# Patient Record
Sex: Female | Born: 1980 | Race: Black or African American | Hispanic: No | Marital: Single | Smoking: Current every day smoker
Health system: Southern US, Community
[De-identification: ages and names within clinical notes are randomized; demographics above are authoritative.]

## PROBLEM LIST (undated history)

## (undated) DIAGNOSIS — I10 Essential (primary) hypertension: Secondary | ICD-10-CM

## (undated) HISTORY — PX: ANKLE SURGERY: SHX546

## (undated) HISTORY — PX: OVARIAN CYST DRAINAGE: SHX325

## (undated) HISTORY — PX: APPENDECTOMY: SHX54

## (undated) HISTORY — DX: Essential (primary) hypertension: I10

---

## 2008-01-08 ENCOUNTER — Encounter: Payer: Self-pay | Admitting: Sports Medicine

## 2008-01-24 ENCOUNTER — Encounter: Payer: Self-pay | Admitting: Sports Medicine

## 2008-02-24 ENCOUNTER — Encounter: Payer: Self-pay | Admitting: Sports Medicine

## 2009-11-11 ENCOUNTER — Ambulatory Visit: Payer: Self-pay

## 2011-08-30 ENCOUNTER — Emergency Department (HOSPITAL_COMMUNITY)
Admission: EM | Admit: 2011-08-30 | Discharge: 2011-08-30 | Disposition: A | Discharge status: Home / Self Care | Payer: Medicaid Other | Attending: Emergency Medicine | Admitting: Emergency Medicine

## 2011-08-30 ENCOUNTER — Emergency Department (HOSPITAL_COMMUNITY): Payer: Medicaid Other

## 2011-08-30 ENCOUNTER — Encounter (HOSPITAL_COMMUNITY): Payer: Self-pay | Admitting: *Deleted

## 2011-08-30 DIAGNOSIS — R109 Unspecified abdominal pain: Secondary | ICD-10-CM

## 2011-08-30 DIAGNOSIS — Z9089 Acquired absence of other organs: Secondary | ICD-10-CM | POA: Insufficient documentation

## 2011-08-30 DIAGNOSIS — B9689 Other specified bacterial agents as the cause of diseases classified elsewhere: Secondary | ICD-10-CM | POA: Insufficient documentation

## 2011-08-30 DIAGNOSIS — N760 Acute vaginitis: Secondary | ICD-10-CM | POA: Insufficient documentation

## 2011-08-30 DIAGNOSIS — A499 Bacterial infection, unspecified: Secondary | ICD-10-CM | POA: Insufficient documentation

## 2011-08-30 DIAGNOSIS — F17200 Nicotine dependence, unspecified, uncomplicated: Secondary | ICD-10-CM | POA: Insufficient documentation

## 2011-08-30 LAB — CBC WITH DIFFERENTIAL/PLATELET
Basophils Relative: 0 % (ref 0–1)
HCT: 41.6 % (ref 36.0–46.0)
Hemoglobin: 14 g/dL (ref 12.0–15.0)
Lymphs Abs: 3.5 10*3/uL (ref 0.7–4.0)
MCH: 30 pg (ref 26.0–34.0)
MCHC: 33.7 g/dL (ref 30.0–36.0)
Monocytes Absolute: 0.6 10*3/uL (ref 0.1–1.0)
Monocytes Relative: 6 % (ref 3–12)
Neutro Abs: 5 10*3/uL (ref 1.7–7.7)
Neutrophils Relative %: 54 % (ref 43–77)
RBC: 4.67 MIL/uL (ref 3.87–5.11)

## 2011-08-30 LAB — COMPREHENSIVE METABOLIC PANEL
Alkaline Phosphatase: 64 U/L (ref 39–117)
BUN: 9 mg/dL (ref 6–23)
CO2: 23 mEq/L (ref 19–32)
Chloride: 103 mEq/L (ref 96–112)
Creatinine, Ser: 0.81 mg/dL (ref 0.50–1.10)
GFR calc Af Amer: >90 mL/min (ref >90)
GFR calc non Af Amer: >90 mL/min (ref >90)
Glucose, Bld: 122 mg/dL — ABNORMAL HIGH (ref 70–99)
Potassium: 4 mEq/L (ref 3.5–5.1)
Total Bilirubin: 0.4 mg/dL (ref 0.3–1.2)

## 2011-08-30 LAB — URINALYSIS, ROUTINE W REFLEX MICROSCOPIC
Bilirubin Urine: NEGATIVE
Glucose, UA: NEGATIVE mg/dL
Ketones, ur: NEGATIVE mg/dL
Leukocytes, UA: NEGATIVE
Nitrite: NEGATIVE
Specific Gravity, Urine: 1.01 (ref 1.005–1.030)
pH: 6 (ref 5.0–8.0)

## 2011-08-30 LAB — WET PREP, GENITAL: Yeast Wet Prep HPF POC: NONE SEEN

## 2011-08-30 LAB — LIPASE, BLOOD: Lipase: 22 U/L (ref 11–59)

## 2011-08-30 LAB — URINE MICROSCOPIC-ADD ON

## 2011-08-30 MED ORDER — SODIUM CHLORIDE 0.9 % IV SOLN
INTRAVENOUS | Status: DC
  Administered 2011-08-30: 08:00:00 via INTRAVENOUS

## 2011-08-30 MED ORDER — MORPHINE SULFATE 4 MG/ML IJ SOLN
4.0000 mg | INTRAMUSCULAR | Status: AC | PRN
  Administered 2011-08-30 (×2): 4 mg via INTRAVENOUS
  Filled 2011-08-30 (×2): qty 1

## 2011-08-30 MED ORDER — HYDROCODONE-ACETAMINOPHEN 5-325 MG PO TABS: ORAL_TABLET | ORAL | Status: AC

## 2011-08-30 MED ORDER — OXYCODONE-ACETAMINOPHEN 5-325 MG PO TABS
2.0000 | ORAL_TABLET | Freq: Once | ORAL | Status: AC
  Administered 2011-08-30: 2 via ORAL
  Filled 2011-08-30: qty 2

## 2011-08-30 MED ORDER — NAPROXEN 250 MG PO TABS: 250.0000 mg | ORAL_TABLET | Freq: Two times a day (BID) | ORAL | Status: AC

## 2011-08-30 MED ORDER — ONDANSETRON HCL 4 MG/2ML IJ SOLN
4.0000 mg | INTRAMUSCULAR | Status: DC | PRN
  Administered 2011-08-30: 4 mg via INTRAVENOUS
  Filled 2011-08-30: qty 2

## 2011-08-30 MED ORDER — METRONIDAZOLE 500 MG PO TABS: 500.0000 mg | ORAL_TABLET | Freq: Two times a day (BID) | ORAL | Status: AC

## 2011-08-30 NOTE — ED Notes (Signed)
Pt remains in xray.

## 2011-08-30 NOTE — ED Provider Notes (Signed)
History  This chart was scribed for Lindsay Anger, DO by [name]. This patient was seen in room APA07/APA07.  CSN: [id number]  Arrival date & time 08/30/11  4098   First MD Initiated Contact with Patient 08/30/11 [phone]      Chief Complaint  Patient presents with  . Abdominal Pain     The history is provided by the patient. No language interpreter was used.  Pt was seen at 0805. Lindsay Shaw is a 31 y.o. female who presents to the Emergency Department complaining of gradual onset and persistence of intermittent right lower abd "pain" for the past 3 days.  Pt describes the pain as "sharp."  States it began while she was sitting down watching TV. The pain was radiating to her back originally but denies back pain currently. She denies having prior episodes of similar symptoms. She denies fever, no N/V/D, no dysuria, no vaginal bleeding/discharge.    History reviewed. No pertinent past medical history.  Past Surgical History  Procedure Date  . Appendectomy 2008  . Cesarean section   . Ankle surgery     History  Substance Use Topics  . Smoking status: Current Everyday Smoker  . Smokeless tobacco: Not on file  . Alcohol Use: Yes     Occ    No OB history provided.  Review of Systems ROS: Statement: All systems negative except as marked or noted in the HPI; Constitutional: Negative for fever and chills. ; ; Eyes: Negative for eye pain, redness and discharge. ; ; ENMT: Negative for ear pain, hoarseness, nasal congestion, sinus pressure and sore throat. ; ; Cardiovascular: Negative for chest pain, palpitations, diaphoresis, dyspnea and peripheral edema. ; ; Respiratory: Negative for cough, wheezing and stridor. ; ; Gastrointestinal: +lower abd pain. Negative for nausea, vomiting, diarrhea, blood in stool, hematemesis, jaundice and rectal bleeding. . ; ; Genitourinary: Negative for dysuria, flank pain and hematuria. ; ; GYN:  No vaginal bleeding, no vaginal discharge, no  vulvar pain. ;; Musculoskeletal: Negative for back pain and neck pain. Negative for swelling and trauma.; ; Skin: Negative for pruritus, rash, abrasions, blisters, bruising and skin lesion.; ; Neuro: Negative for headache, lightheadedness and neck stiffness. Negative for weakness, altered level of consciousness , altered mental status, extremity weakness, paresthesias, involuntary movement, seizure and syncope.      Allergies  Review of patient's allergies indicates no known allergies.  Home Medications  No current outpatient prescriptions on file.  Triage Vitals: BP 125/81  Pulse 84  Temp 97.9 F (36.6 C) (Oral)  Resp 16  Ht 5\' 3"  (1.6 m)  Wt 190 lb (86.183 kg)  BMI 33.66 kg/m2  SpO2 98%  LMP 08/15/2011  Physical Exam 0810: Physical examination:  Nursing notes reviewed; Vital signs and O2 SAT reviewed;  Constitutional: Well developed, Well nourished, Well hydrated, In no acute distress; Head:  Normocephalic, atraumatic; Eyes: EOMI, PERRL, No scleral icterus; ENMT: Mouth and pharynx normal, Mucous membranes moist; Neck: Supple, Full range of motion, No lymphadenopathy; Cardiovascular: Regular rate and rhythm, No murmur, rub, or gallop; Respiratory: Breath sounds clear & equal bilaterally, No rales, rhonchi, wheezes.  Speaking full sentences with ease, Normal respiratory effort/excursion; Chest: Nontender, Movement normal; Abdomen: Soft, +right lower pelvic and suprapubic areas tender to palp.  No rebound or guarding. Nondistended, Normal bowel sounds; Genitourinary: No CVA tenderness, Pelvic exam performed with permission of pt and female ED tech assist during exam.  External genitalia w/o lesions. Vaginal vault with thick white discharge.  Cervix  w/o lesions, not friable, GC/chlam and wet prep obtained and sent to lab.  Bimanual exam w/o CMT, +uterine and right pelvic tenderness.;; Spine:  No midline CS, TS, LS tenderness.;; Extremities: Pulses normal, No tenderness, No edema, No calf edema  or asymmetry.; Neuro: AA&Ox3, Major CN grossly intact.  Speech clear. No gross focal motor or sensory deficits in extremities.; Skin: Color normal, Warm, Dry.   ED Course  Procedures    MDM  MDM Reviewed: nursing note and vitals Interpretation: labs and ultrasound   Results for orders placed during the hospital encounter of 08/30/11  PREGNANCY, URINE      Component Value Range   Preg Test, Ur NEGATIVE  NEGATIVE  URINALYSIS, ROUTINE W REFLEX MICROSCOPIC      Component Value Range   Color, Urine YELLOW  YELLOW   APPearance CLEAR  CLEAR   Specific Gravity, Urine 1.010  1.005 - 1.030   pH 6.0  5.0 - 8.0   Glucose, UA NEGATIVE  NEGATIVE mg/dL   Hgb urine dipstick SMALL (*) NEGATIVE   Bilirubin Urine NEGATIVE  NEGATIVE   Ketones, ur NEGATIVE  NEGATIVE mg/dL   Protein, ur NEGATIVE  NEGATIVE mg/dL   Urobilinogen, UA 0.2  0.0 - 1.0 mg/dL   Nitrite NEGATIVE  NEGATIVE   Leukocytes, UA NEGATIVE  NEGATIVE  CBC WITH DIFFERENTIAL      Component Value Range   WBC 9.4  4.0 - 10.5 K/uL   RBC 4.67  3.87 - 5.11 MIL/uL   Hemoglobin 14.0  12.0 - 15.0 g/dL   HCT 46.9  62.9 - 52.8 %   MCV 89.1  78.0 - 100.0 fL   MCH 30.0  26.0 - 34.0 pg   MCHC 33.7  30.0 - 36.0 g/dL   RDW 41.3  24.4 - 01.0 %   Platelets 275  150 - 400 K/uL   Neutrophils Relative 54  43 - 77 %   Neutro Abs 5.0  1.7 - 7.7 K/uL   Lymphocytes Relative 37  12 - 46 %   Lymphs Abs 3.5  0.7 - 4.0 K/uL   Monocytes Relative 6  3 - 12 %   Monocytes Absolute 0.6  0.1 - 1.0 K/uL   Eosinophils Relative 3  0 - 5 %   Eosinophils Absolute 0.3  0.0 - 0.7 K/uL   Basophils Relative 0  0 - 1 %   Basophils Absolute 0.0  0.0 - 0.1 K/uL  COMPREHENSIVE METABOLIC PANEL      Component Value Range   Sodium 136  135 - 145 mEq/L   Potassium 4.0  3.5 - 5.1 mEq/L   Chloride 103  96 - 112 mEq/L   CO2 23  19 - 32 mEq/L   Glucose, Bld 122 (*) 70 - 99 mg/dL   BUN 9  6 - 23 mg/dL   Creatinine, Ser 2.72  0.50 - 1.10 mg/dL   Calcium 9.6  8.4 - 53.6  mg/dL   Total Protein 7.7  6.0 - 8.3 g/dL   Albumin 3.9  3.5 - 5.2 g/dL   AST 18  0 - 37 U/L   ALT 13  0 - 35 U/L   Alkaline Phosphatase 64  39 - 117 U/L   Total Bilirubin 0.4  0.3 - 1.2 mg/dL   GFR calc non Af Amer >90  >90 mL/min   GFR calc Af Amer >90  >90 mL/min  LIPASE, BLOOD      Component Value Range   Lipase 22  11 - 59 U/L  WET PREP, GENITAL      Component Value Range   Yeast Wet Prep HPF POC NONE SEEN  NONE SEEN   Trich, Wet Prep NONE SEEN  NONE SEEN   Clue Cells Wet Prep HPF POC MANY (*) NONE SEEN   WBC, Wet Prep HPF POC RARE (*) NONE SEEN  URINE MICROSCOPIC-ADD ON      Component Value Range   Squamous Epithelial / LPF RARE  RARE   RBC / HPF 0-2  <3 RBC/hpf   Ct Abdomen Pelvis Wo Contrast 08/30/2011  *RADIOLOGY REPORT*  Clinical Data: Abdominal pain  CT ABDOMEN AND PELVIS WITHOUT CONTRAST  Technique:  Multidetector CT imaging of the abdomen and pelvis was performed following the standard protocol without intravenous contrast.  Comparison: None.  Findings: The liver and spleen have normal uninfused features.  The stomach, duodenum, pancreas, gallbladder, and adrenal glands are unremarkable.  The kidneys have normal uninfused features without evidence for stones or hydronephrosis. No evidence for ureteral or bladder stones.  No abdominal aortic aneurysm.  There is no free fluid or lymphadenopathy in the abdomen.  Abdominal bowel loops are unremarkable.  Imaging through the pelvis shows a trace amount of free fluid in the cul-de-sac.  There is no pelvic sidewall lymphadenopathy.  Uterus and bladder are unremarkable.  There is no adnexal mass.  No evidence for colonic diverticulitis.  The terminal ileum is normal.  Suture line at the cecal tip is compatible with the reported history of previous appendectomy.  Bone windows reveal no worrisome lytic or sclerotic osseous lesions.  IMPRESSION: No acute findings in the abdomen or pelvis on this study performed without intravenous contrast  material.  Specifically, no findings to explain the patient's history of right lower quadrant pain.  Original Report Authenticated By: ERIC A. MANSELL, M.D.   US Transvaginal Non-ob 08/30/2011  *RADIOLOGY REPORT*  Clinical Data:  31 year old with right pelvic pain.  Evaluate for torsion or abscess.  TRANSABDOMINAL AND TRANSVAGINAL ULTRASOUND OF PELVIS DOPPLER ULTRASOUND OF OVARIES  Technique:  Both transabdominal and transvaginal ultrasound examinations of the pelvis were performed. Transabdominal technique was performed for global imaging of the pelvis including uterus, ovaries, adnexal regions, and pelvic cul-de-sac.  It was necessary to proceed with endovaginal exam following the transabdominal exam to visualize the endometrium and ovaries to better advantage.  Color and duplex Doppler ultrasound was utilized to evaluate blood flow to the ovaries.  Comparison:  None.  Findings:  Uterus:  The myometrium appears mildly heterogeneous without focal lesion.  Uterine dimensions are 9.3 x 5.1 x 5.8 cm.  Endometrium:  9.8 mm in thickness and homogeneous in echogenicity.  Right ovary: Suboptimally visualized, but normal in size and without focal abnormality.  Measures 2.9 x 2.0 x 2.4 cm.  Blood flow is seen with color Doppler.  Left ovary:   Suboptimally visualized, but normal in size without focal abnormality.  Measures 2.9 x 1.8 x 1.9 cm.  Blood flow is seen with color Doppler.  A small amount of free pelvic fluid is noted adjacent to the uterine fundus.  Pulsed Doppler evaluation demonstrates normal low-resistance arterial and venous waveforms in both ovaries.  IMPRESSION:  1.  No evidence of tubal ovarian abscess or ovarian torsion. 2.  Both ovaries appear unremarkable. 3.  Mildly heterogeneous uterine myometrium without focal abnormality.  Original Report Authenticated By: Gerrianne Scale, M.D.     1250:  No acute findings in abd or pelvis to account for pain.  Will tx for BV.  GC/chlam pending.  Feels  improved after meds and wants to go home now.  Dx testing d/w pt.  Questions answered.  Verb understanding, agreeable to d/c home with outpt f/u.       I personally performed the services described in this documentation, which was scribed in my presence. The recorded information has been reviewed and considered. Adalaide Jaskolski Allison Quarry, DO 09/02/11 1330

## 2011-08-30 NOTE — ED Notes (Signed)
RLQ / right groin area pain x 3 days. Described as intermittent, sharp pain. Denies N/V/D, urinary symptoms or vaginal d/c

## 2011-08-30 NOTE — ED Notes (Signed)
Pt c/o right lower quad abd pain that started three days ago, last bowel movement was either Sunday or Monday pt unable to remember, denies any n/v/d,

## 2011-08-30 NOTE — ED Notes (Signed)
Pt returns from xray, states that the pain is better,

## 2011-08-30 NOTE — ED Notes (Signed)
Patient with no complaints at this time. Respirations even and unlabored. Skin warm/dry. Discharge instructions reviewed with patient at this time. Patient given opportunity to voice concerns/ask questions. IV removed per policy and band-aid applied to site. Patient discharged at this time and left Emergency Department with steady gait.  

## 2011-08-30 NOTE — ED Notes (Addendum)
Pt states that the pain medication is only making her sleepy, does not help with the pain,  Dr, Clarene Duke notified, additional orders given

## 2016-07-09 ENCOUNTER — Emergency Department (HOSPITAL_COMMUNITY)
Admission: EM | Admit: 2016-07-09 | Discharge: 2016-07-10 | Disposition: A | Discharge status: Home / Self Care | Payer: Medicaid Other | Attending: Emergency Medicine | Admitting: Emergency Medicine

## 2016-07-09 ENCOUNTER — Emergency Department (HOSPITAL_COMMUNITY): Payer: Medicaid Other

## 2016-07-09 ENCOUNTER — Encounter (HOSPITAL_COMMUNITY): Payer: Self-pay | Admitting: Emergency Medicine

## 2016-07-09 DIAGNOSIS — F17200 Nicotine dependence, unspecified, uncomplicated: Secondary | ICD-10-CM | POA: Diagnosis not present

## 2016-07-09 DIAGNOSIS — M25561 Pain in right knee: Secondary | ICD-10-CM | POA: Diagnosis present

## 2016-07-09 MED ORDER — IBUPROFEN 800 MG PO TABS
800.0000 mg | ORAL_TABLET | Freq: Once | ORAL | Status: AC
  Administered 2016-07-10: 800 mg via ORAL
  Filled 2016-07-09: qty 1

## 2016-07-09 MED ORDER — TRAMADOL HCL 50 MG PO TABS
50.0000 mg | ORAL_TABLET | Freq: Four times a day (QID) | ORAL | 0 refills | Status: DC | PRN
Start: 2016-07-09 — End: 2017-07-04

## 2016-07-09 MED ORDER — IBUPROFEN 600 MG PO TABS: 600.0000 mg | ORAL_TABLET | Freq: Four times a day (QID) | ORAL | 0 refills | Status: DC | PRN

## 2016-07-09 NOTE — Discharge Instructions (Signed)
Apply ice packs on/off to your knee.  Wear the brace when walking or standing, do not wear it continuously.  Call Dr. Ruthe Mannan office to arrange a follow-up appt.

## 2016-07-09 NOTE — ED Notes (Signed)
Pt to xray at this time.

## 2016-07-09 NOTE — ED Triage Notes (Signed)
R knee pain for the last 3 weeks  Has not followed with PCP Denies injury  [facility]

## 2016-07-12 NOTE — ED Provider Notes (Signed)
[facility] DEPT Provider Note   CSN: [id number] Arrival date & time: 07/09/16  2132     History   Chief Complaint Chief Complaint  Patient presents with  . Knee Pain    HPI Lindsay Shaw is a 36 y.o. female.  HPI   Lindsay Shaw is a 36 y.o. female who presents to the Emergency Department complaining of pain to right knee for 3 weeks.  Describes an aching pain that is worse with weight bearing.  Denies known injury, but states that she stands all day at her job.  She has not tried any therapies or medications.  She denies swelling, redness, fever, chills, and calf pain.    History reviewed. No pertinent past medical history.  There are no active problems to display for this patient.   Past Surgical History:  Procedure Laterality Date  . ANKLE SURGERY    . APPENDECTOMY    . CESAREAN SECTION      OB History    No data available       Home Medications    Prior to Admission medications   Medication Sig Start Date End Date Taking? Authorizing Provider  cholecalciferol (VITAMIN D) 1000 UNITS tablet Take 2,000 Units by mouth daily.    [provider]  ibuprofen (ADVIL,MOTRIN) 600 MG tablet Take 1 tablet (600 mg total) by mouth every 6 (six) hours as needed. 07/09/16   Naavya Postma, PA-C  traMADol (ULTRAM) 50 MG tablet Take 1 tablet (50 mg total) by mouth every 6 (six) hours as needed. 07/09/16   Sahir Tolson, Lynelle Smoke, PA-C    Family History No family history on file.  Social History Social History  Substance Use Topics  . Smoking status: Current Every Day Smoker    Packs/day: 1.00  . Smokeless tobacco: Never Used  . Alcohol use Yes     Comment: Occ     Allergies   Patient has no known allergies.   Review of Systems Review of Systems  Constitutional: Negative for chills and fever.  Genitourinary: Negative for difficulty urinating and dysuria.  Musculoskeletal: Positive for arthralgias (right knee pain). Negative for joint swelling.  Skin:  Negative for color change and wound.  Neurological: Negative for weakness and numbness.  All other systems reviewed and are negative.    Physical Exam Updated Vital Signs BP (!) 134/94 (BP Location: Right Arm)   Pulse 91   Temp 98.1 F (36.7 C) (Oral)   Resp 18   Ht 5\' 3"  (1.6 m)   Wt 99.6 kg (219 lb 9.6 oz)   LMP 06/28/2016 (Approximate)   SpO2 97%   BMI 38.90 kg/m   Physical Exam  Constitutional: She is oriented to person, place, and time. She appears well-developed and well-nourished. No distress.  Cardiovascular: Normal rate, regular rhythm, normal heart sounds and intact distal pulses.   Pulmonary/Chest: Effort normal and breath sounds normal.  Musculoskeletal: Normal range of motion. She exhibits tenderness. She exhibits no edema or deformity.  ttp of the anterior and medial right knee.  Mild patella crepitus.  No edema, erythema, effusion, or step-off deformity.   Calf is soft and NT.  Neurological: She is alert and oriented to person, place, and time. She exhibits normal muscle tone. Coordination normal.  Skin: Skin is warm and dry. Capillary refill takes less than 2 seconds. No erythema.  Psychiatric: She has a normal mood and affect.  Nursing note and vitals reviewed.    ED Treatments / Results  Labs (all labs ordered are  listed, but only abnormal results are displayed) Labs Reviewed - No data to display  EKG  EKG Interpretation None       Radiology Dg Knee Complete 4 Views Right  Result Date: 07/09/2016 CLINICAL DATA:  36 year old female with right knee pain. No known injury. EXAM: RIGHT KNEE - COMPLETE 4+ VIEW COMPARISON:  None. FINDINGS: There is a focal area of cortical irregularity with slight angulation of the proximal fibula, seen on the lateral projection. This is likely chronic and related to an old healed fracture. An acute fracture is not entirely excluded. Clinical correlation is recommended. No other acute fracture identified. The bones are well  mineralized. No significant arthritic changes. No joint effusion. The soft tissues are grossly unremarkable. IMPRESSION: Focal cortical irregularity of the proximal fibula, likely chronic. An acute fracture is less likely but not excluded. Clinical correlation is recommended. Electronically Signed   By: Anner Crete M.D.   On: 07/09/2016 23:33  '  Procedures Procedures (including critical care time)  Medications Ordered in ED Medications  ibuprofen (ADVIL,MOTRIN) tablet 800 mg (800 mg Oral Given 07/10/16 0006)     Initial Impression / Assessment and Plan / ED Course  I have reviewed the triage vital signs and the nursing notes.  Pertinent labs & imaging results that were available during my care of the patient were reviewed by me and considered in my medical decision making (see chart for details).     Pt is well appearing.  Anterior tenderness of the right knee w/o concerning sx's for septic joint.  No effusion.  Ambulates with steady gait.    Knee sleeve applied. Pain improved.  Pt agrees to symptomatic tx and orthopedic f/u if not improving.   Final Clinical Impressions(s) / ED Diagnoses   Final diagnoses:  Acute pain of right knee    New Prescriptions Discharge Medication List as of 07/09/2016 11:59 PM    START taking these medications   Details  ibuprofen (ADVIL,MOTRIN) 600 MG tablet Take 1 tablet (600 mg total) by mouth every 6 (six) hours as needed., Starting Sun 07/09/2016, Print    traMADol (ULTRAM) 50 MG tablet Take 1 tablet (50 mg total) by mouth every 6 (six) hours as needed., Starting Sun 07/09/2016, Print         [place], [place], PA-C 07/12/16 1616    Horton, Barbette Hair, MD 07/12/16 2259

## 2016-08-04 ENCOUNTER — Encounter: Payer: Self-pay | Admitting: Emergency Medicine

## 2016-08-04 ENCOUNTER — Emergency Department (HOSPITAL_COMMUNITY)
Admission: EM | Admit: 2016-08-04 | Discharge: 2016-08-04 | Disposition: A | Discharge status: Home / Self Care | Payer: Medicaid Other | Attending: Emergency Medicine | Admitting: Emergency Medicine

## 2016-08-04 DIAGNOSIS — R109 Unspecified abdominal pain: Secondary | ICD-10-CM | POA: Diagnosis present

## 2016-08-04 DIAGNOSIS — Z5321 Procedure and treatment not carried out due to patient leaving prior to being seen by health care provider: Secondary | ICD-10-CM | POA: Diagnosis not present

## 2016-08-04 LAB — URINALYSIS, ROUTINE W REFLEX MICROSCOPIC
BILIRUBIN URINE: NEGATIVE
Bacteria, UA: NONE SEEN
GLUCOSE, UA: NEGATIVE mg/dL
KETONES UR: NEGATIVE mg/dL
LEUKOCYTES UA: NEGATIVE
NITRITE: NEGATIVE
PH: 7 (ref 5.0–8.0)
Protein, ur: NEGATIVE mg/dL
SPECIFIC GRAVITY, URINE: 1.004 — AB (ref 1.005–1.030)

## 2016-08-04 LAB — POC URINE PREG, ED: Preg Test, Ur: NEGATIVE

## 2016-08-04 NOTE — ED Triage Notes (Signed)
Pt reports mid to upper abd pain x several days, sharp in nature, constant, denies n/v/d or constipation

## 2016-08-04 NOTE — ED Notes (Signed)
Pt states "I aint laying in pain in here any longer." Pt seen leaving the ER with steady gate. Pt encouraged to stay for treatment. Pt refused.

## 2016-08-07 DIAGNOSIS — R1030 Lower abdominal pain, unspecified: Secondary | ICD-10-CM | POA: Diagnosis present

## 2016-08-07 DIAGNOSIS — N72 Inflammatory disease of cervix uteri: Secondary | ICD-10-CM | POA: Insufficient documentation

## 2016-08-07 DIAGNOSIS — R102 Pelvic and perineal pain: Secondary | ICD-10-CM | POA: Insufficient documentation

## 2016-08-07 DIAGNOSIS — F17200 Nicotine dependence, unspecified, uncomplicated: Secondary | ICD-10-CM | POA: Insufficient documentation

## 2016-08-08 ENCOUNTER — Emergency Department (HOSPITAL_COMMUNITY)
Admission: EM | Admit: 2016-08-08 | Discharge: 2016-08-08 | Disposition: A | Discharge status: Home / Self Care | Payer: Medicaid Other | Attending: Emergency Medicine | Admitting: Emergency Medicine

## 2016-08-08 ENCOUNTER — Encounter (HOSPITAL_COMMUNITY): Payer: Self-pay | Admitting: Emergency Medicine

## 2016-08-08 DIAGNOSIS — N72 Inflammatory disease of cervix uteri: Secondary | ICD-10-CM

## 2016-08-08 DIAGNOSIS — R102 Pelvic and perineal pain: Secondary | ICD-10-CM

## 2016-08-08 LAB — CBC
HCT: 35.5 % — ABNORMAL LOW (ref 36.0–46.0)
Hemoglobin: 11.5 g/dL — ABNORMAL LOW (ref 12.0–15.0)
MCH: 28.8 pg (ref 26.0–34.0)
MCHC: 32.4 g/dL (ref 30.0–36.0)
MCV: 88.8 fL (ref 78.0–100.0)
PLATELETS: 372 10*3/uL (ref 150–400)
RBC: 4 MIL/uL (ref 3.87–5.11)
RDW: 14.8 % (ref 11.5–15.5)
WBC: 10.1 10*3/uL (ref 4.0–10.5)

## 2016-08-08 LAB — URINALYSIS, ROUTINE W REFLEX MICROSCOPIC
Bacteria, UA: NONE SEEN
Bilirubin Urine: NEGATIVE
Glucose, UA: NEGATIVE mg/dL
Ketones, ur: NEGATIVE mg/dL
LEUKOCYTES UA: NEGATIVE
Nitrite: NEGATIVE
PROTEIN: 30 mg/dL — AB
SPECIFIC GRAVITY, URINE: 1.029 (ref 1.005–1.030)
pH: 5 (ref 5.0–8.0)

## 2016-08-08 LAB — COMPREHENSIVE METABOLIC PANEL
ALK PHOS: 62 U/L (ref 38–126)
ALT: 15 U/L (ref 14–54)
AST: 20 U/L (ref 15–41)
Albumin: 3.5 g/dL (ref 3.5–5.0)
Anion gap: 9 (ref 5–15)
BUN: 14 mg/dL (ref 6–20)
CALCIUM: 8.9 mg/dL (ref 8.9–10.3)
CHLORIDE: 105 mmol/L (ref 101–111)
CO2: 24 mmol/L (ref 22–32)
CREATININE: 0.79 mg/dL (ref 0.44–1.00)
Glucose, Bld: 93 mg/dL (ref 65–99)
Potassium: 3.7 mmol/L (ref 3.5–5.1)
Sodium: 138 mmol/L (ref 135–145)
TOTAL PROTEIN: 7.4 g/dL (ref 6.5–8.1)
Total Bilirubin: 0.3 mg/dL (ref 0.3–1.2)

## 2016-08-08 LAB — WET PREP, GENITAL
Sperm: NONE SEEN
Trich, Wet Prep: NONE SEEN
YEAST WET PREP: NONE SEEN

## 2016-08-08 LAB — PREGNANCY, URINE: PREG TEST UR: NEGATIVE

## 2016-08-08 LAB — LIPASE, BLOOD: LIPASE: 21 U/L (ref 11–51)

## 2016-08-08 MED ORDER — LIDOCAINE HCL (PF) 2 % IJ SOLN
INTRAMUSCULAR | Status: AC
  Filled 2016-08-08: qty 10

## 2016-08-08 MED ORDER — KETOROLAC TROMETHAMINE 60 MG/2ML IM SOLN
60.0000 mg | Freq: Once | INTRAMUSCULAR | Status: AC
  Administered 2016-08-08: 60 mg via INTRAMUSCULAR
  Filled 2016-08-08: qty 2

## 2016-08-08 MED ORDER — HYDROCODONE-ACETAMINOPHEN 5-325 MG PO TABS: 1.0000 | ORAL_TABLET | ORAL | 0 refills | Status: DC | PRN

## 2016-08-08 MED ORDER — AZITHROMYCIN 250 MG PO TABS
1000.0000 mg | ORAL_TABLET | Freq: Once | ORAL | Status: AC
  Administered 2016-08-08: 1000 mg via ORAL
  Filled 2016-08-08: qty 4

## 2016-08-08 MED ORDER — LIDOCAINE HCL (PF) 1 % IJ SOLN
INTRAMUSCULAR | Status: AC
  Administered 2016-08-08: 0.9 mL
  Filled 2016-08-08: qty 5

## 2016-08-08 MED ORDER — CEFTRIAXONE SODIUM 250 MG IJ SOLR
250.0000 mg | Freq: Once | INTRAMUSCULAR | Status: AC
  Administered 2016-08-08: 250 mg via INTRAMUSCULAR
  Filled 2016-08-08: qty 250

## 2016-08-08 MED ORDER — DOXYCYCLINE HYCLATE 100 MG PO TABS
100.0000 mg | ORAL_TABLET | Freq: Once | ORAL | Status: AC
  Administered 2016-08-08: 100 mg via ORAL
  Filled 2016-08-08: qty 1

## 2016-08-08 MED ORDER — KETOROLAC TROMETHAMINE 30 MG/ML IJ SOLN: 15.0000 mg | Freq: Once | INTRAMUSCULAR | Status: DC

## 2016-08-08 NOTE — ED Notes (Signed)
Pelvic cart to bedside 

## 2016-08-08 NOTE — ED Triage Notes (Signed)
Pt c/o lower abd pain with sharp pains in vagina for 2 weeks, denies n/v/d

## 2016-08-08 NOTE — ED Provider Notes (Signed)
[facility] DEPT Provider Note   CSN: [id number] Arrival date & time: 08/07/16  2358     History   Chief Complaint Chief Complaint  Patient presents with  . Abdominal Pain    HPI Lindsay Shaw is a 36 y.o. female.  Patient presents to the emergency department for evaluation of lower abdominal pain. Patient experiencing pain for 2 weeks. She reports the pain initially was diffuse but now it is more in the lower abdomen and shoots into the vaginal area. Patient denies nausea, vomiting, diarrhea. Has not had a fever.      History reviewed. No pertinent past medical history.  There are no active problems to display for this patient.   Past Surgical History:  Procedure Laterality Date  . ANKLE SURGERY    . APPENDECTOMY    . CESAREAN SECTION      OB History    No data available       Home Medications    Prior to Admission medications   Medication Sig Start Date End Date Taking? Authorizing Provider  cholecalciferol (VITAMIN D) 1000 UNITS tablet Take 2,000 Units by mouth daily.    [provider]  doxycycline (VIBRAMYCIN) 100 MG capsule Take 100 mg by mouth 2 (two) times daily.    [provider]  HYDROcodone-acetaminophen (NORCO/VICODIN) 5-325 MG tablet Take 1 tablet by mouth every 4 (four) hours as needed for moderate pain. 08/08/16   Orpah Greek, MD  ibuprofen (ADVIL,MOTRIN) 600 MG tablet Take 1 tablet (600 mg total) by mouth every 6 (six) hours as needed. 07/09/16   Triplett, Tammy, PA-C  traMADol (ULTRAM) 50 MG tablet Take 1 tablet (50 mg total) by mouth every 6 (six) hours as needed. 07/09/16   Triplett, Lynelle Smoke, PA-C    Family History No family history on file.  Social History Social History  Substance Use Topics  . Smoking status: Current Every Day Smoker    Packs/day: 1.00  . Smokeless tobacco: Never Used  . Alcohol use Yes     Comment: Occ     Allergies   Patient has no known allergies.   Review of Systems Review  of Systems  Gastrointestinal: Positive for abdominal pain.  Genitourinary: Positive for pelvic pain.  All other systems reviewed and are negative.    Physical Exam Updated Vital Signs BP (!) 134/97   Pulse 87   Temp 98.3 F (36.8 C) (Oral)   Resp 18   Ht 5\' 3"  (1.6 m)   Wt 90.7 kg (200 lb)   LMP 07/24/2016 (Approximate)   SpO2 97%   BMI 35.43 kg/m   Physical Exam  Constitutional: She is oriented to person, place, and time. She appears well-developed and well-nourished. No distress.  HENT:  Head: Normocephalic and atraumatic.  Right Ear: Hearing normal.  Left Ear: Hearing normal.  Nose: Nose normal.  Mouth/Throat: Oropharynx is clear and moist and mucous membranes are normal.  Eyes: Pupils are equal, round, and reactive to light. Conjunctivae and EOM are normal.  Neck: Normal range of motion. Neck supple.  Cardiovascular: Regular rhythm, S1 normal and S2 normal.  Exam reveals no gallop and no friction rub.   No murmur heard. Pulmonary/Chest: Effort normal and breath sounds normal. No respiratory distress. She exhibits no tenderness.  Abdominal: Soft. Normal appearance and bowel sounds are normal. There is no hepatosplenomegaly. There is no tenderness. There is no rebound, no guarding, no tenderness at McBurney's point and negative Murphy's sign. No hernia.  Genitourinary: Vagina normal. There  is no rash or tenderness on the right labia. There is no rash or tenderness on the left labia. Cervix exhibits motion tenderness. Right adnexum displays no mass and no tenderness. Left adnexum displays no mass and no tenderness.  Musculoskeletal: Normal range of motion.  Neurological: She is alert and oriented to person, place, and time. She has normal strength. No cranial nerve deficit or sensory deficit. Coordination normal. GCS eye subscore is 4. GCS verbal subscore is 5. GCS motor subscore is 6.  Skin: Skin is warm, dry and intact. No rash noted. No cyanosis.  Psychiatric: She has a  normal mood and affect. Her speech is normal and behavior is normal. Thought content normal.  Nursing note and vitals reviewed.    ED Treatments / Results  Labs (all labs ordered are listed, but only abnormal results are displayed) Labs Reviewed  WET PREP, GENITAL - Abnormal; Notable for the following:       Result Value   Clue Cells Wet Prep HPF POC PRESENT (*)    WBC, Wet Prep HPF POC FEW (*)    All other components within normal limits  CBC - Abnormal; Notable for the following:    Hemoglobin 11.5 (*)    HCT 35.5 (*)    All other components within normal limits  URINALYSIS, ROUTINE W REFLEX MICROSCOPIC - Abnormal; Notable for the following:    APPearance HAZY (*)    Hgb urine dipstick SMALL (*)    Protein, ur 30 (*)    Squamous Epithelial / LPF 6-30 (*)    All other components within normal limits  LIPASE, BLOOD  COMPREHENSIVE METABOLIC PANEL  PREGNANCY, URINE  GC/CHLAMYDIA PROBE AMP ([employer]) NOT AT [facility]    EKG  EKG Interpretation None       Radiology No results found.  Procedures Procedures (including critical care time)  Medications Ordered in ED Medications  cefTRIAXone (ROCEPHIN) injection 250 mg (not administered)  doxycycline (VIBRA-TABS) tablet 100 mg (not administered)  azithromycin (ZITHROMAX) tablet 1,000 mg (not administered)  ketorolac (TORADOL) injection 60 mg (60 mg Intramuscular Given 08/08/16 0145)     Initial Impression / Assessment and Plan / ED Course  I have reviewed the triage vital signs and the nursing notes.  Pertinent labs & imaging results that were available during my care of the patient were reviewed by me and considered in my medical decision making (see chart for details).     Patient presents to the ER for evaluation of lower abdominal and pelvic pain. Patient reports diffuse pain initially, now essentially suprapubic and left pelvic area pain with tenderness. No signs peritonitis. Blood work unremarkable. Pelvic exam  reveals significant cervical motion tenderness. No significant discharge, however. Patient treated with Rocephin and Zithromax. She is on doxycycline chronically, GC chlamydia cultures pending. No large masses felt on examination. Pain appears to be intermittent and spasmodic, low suspicion for torsion. Will refer to OB/GYN.  Final Clinical Impressions(s) / ED Diagnoses   Final diagnoses:  Cervicitis  Pelvic pain in female    New Prescriptions New Prescriptions   HYDROCODONE-ACETAMINOPHEN (NORCO/VICODIN) 5-325 MG TABLET    Take 1 tablet by mouth every 4 (four) hours as needed for moderate pain.     Orpah Greek, MD 08/08/16 [phone]

## 2016-08-09 LAB — GC/CHLAMYDIA PROBE AMP (~~LOC~~) NOT AT ARMC
CHLAMYDIA, DNA PROBE: NEGATIVE
NEISSERIA GONORRHEA: NEGATIVE

## 2016-08-17 MED FILL — Hydrocodone-Acetaminophen Tab 5-325 MG: ORAL | Qty: 6 | Status: AC

## 2017-02-12 ENCOUNTER — Encounter (HOSPITAL_COMMUNITY): Payer: Self-pay | Admitting: *Deleted

## 2017-02-12 ENCOUNTER — Emergency Department (HOSPITAL_COMMUNITY)
Admission: EM | Admit: 2017-02-12 | Discharge: 2017-02-12 | Disposition: A | Discharge status: Home / Self Care | Payer: Medicaid Other | Attending: Emergency Medicine | Admitting: Emergency Medicine

## 2017-02-12 ENCOUNTER — Other Ambulatory Visit: Payer: Self-pay

## 2017-02-12 DIAGNOSIS — R05 Cough: Secondary | ICD-10-CM | POA: Insufficient documentation

## 2017-02-12 DIAGNOSIS — Z79899 Other long term (current) drug therapy: Secondary | ICD-10-CM | POA: Insufficient documentation

## 2017-02-12 DIAGNOSIS — B349 Viral infection, unspecified: Secondary | ICD-10-CM | POA: Diagnosis not present

## 2017-02-12 DIAGNOSIS — F17200 Nicotine dependence, unspecified, uncomplicated: Secondary | ICD-10-CM | POA: Diagnosis not present

## 2017-02-12 DIAGNOSIS — R52 Pain, unspecified: Secondary | ICD-10-CM

## 2017-02-12 MED ORDER — IBUPROFEN 800 MG PO TABS
800.0000 mg | ORAL_TABLET | Freq: Once | ORAL | Status: AC
  Administered 2017-02-12: 800 mg via ORAL
  Filled 2017-02-12: qty 1

## 2017-02-12 NOTE — ED Notes (Signed)
Pt alert & oriented x4, stable gait. Patient given discharge instructions, paperwork & prescription(s). Patient  instructed to stop at the registration desk to finish any additional paperwork. Patient verbalized understanding. Pt left department w/ no further questions. 

## 2017-02-12 NOTE — ED Notes (Signed)
Pt states general body aches x 2 days. Reports has taking tylenol w/o any relief, still rating pain a 10/10. Pt denies any fevers, N/V/D.

## 2017-02-12 NOTE — ED Provider Notes (Signed)
[facility] EMERGENCY DEPARTMENT Provider Note   CSN: [id number] Arrival date & time: 02/12/17  1834     History   Chief Complaint Chief Complaint  Patient presents with  . Generalized Body Aches    HPI Lindsay Shaw is a 37 y.o. female.  The history is provided by the patient. No language interpreter was used.  Cough  This is a new problem. The current episode started 3 to 5 hours ago. The problem occurs constantly. The cough is non-productive. There has been no fever. Pertinent negatives include no headaches and no wheezing. She has tried nothing for the symptoms. The treatment provided no relief. She is not a smoker.  Pt reports she began feeling sick at work. Pt reports she aches all over.   History reviewed. No pertinent past medical history.  There are no active problems to display for this patient.   Past Surgical History:  Procedure Laterality Date  . ANKLE SURGERY    . APPENDECTOMY    . CESAREAN SECTION      OB History    No data available       Home Medications    Prior to Admission medications   Medication Sig Start Date End Date Taking? Authorizing Provider  cholecalciferol (VITAMIN D) 1000 UNITS tablet Take 2,000 Units by mouth daily.    [provider]  doxycycline (VIBRAMYCIN) 100 MG capsule Take 100 mg by mouth 2 (two) times daily.    [provider]  HYDROcodone-acetaminophen (NORCO/VICODIN) 5-325 MG tablet Take 1 tablet by mouth every 4 (four) hours as needed for moderate pain. 08/08/16   Orpah Greek, MD  HYDROcodone-acetaminophen (NORCO/VICODIN) 5-325 MG tablet Take 1-2 tablets by mouth every 4 (four) hours as needed. 08/08/16   Orpah Greek, MD  ibuprofen (ADVIL,MOTRIN) 600 MG tablet Take 1 tablet (600 mg total) by mouth every 6 (six) hours as needed. 07/09/16   Triplett, Tammy, PA-C  traMADol (ULTRAM) 50 MG tablet Take 1 tablet (50 mg total) by mouth every 6 (six) hours as needed. 07/09/16   Triplett, Lynelle Smoke,  PA-C    Family History No family history on file.  Social History Social History   Tobacco Use  . Smoking status: Current Every Day Smoker    Packs/day: 1.00  . Smokeless tobacco: Never Used  Substance Use Topics  . Alcohol use: Yes    Comment: Occ  . Drug use: No     Allergies   Patient has no known allergies.   Review of Systems Review of Systems  Respiratory: Positive for cough. Negative for wheezing.   Neurological: Negative for headaches.  All other systems reviewed and are negative.    Physical Exam Updated Vital Signs BP (!) 127/93   Pulse 88   Temp 98.3 F (36.8 C) (Oral)   Resp 18   Ht 5\' 3"  (1.6 m)   Wt 96.6 kg (213 lb)   LMP 01/23/2017 (Approximate)   SpO2 100%   BMI 37.73 kg/m   Physical Exam  Constitutional: She appears well-developed and well-nourished. No distress.  HENT:  Head: Normocephalic and atraumatic.  Eyes: Conjunctivae are normal.  Neck: Neck supple.  Cardiovascular: Normal rate and regular rhythm.  No murmur heard. Pulmonary/Chest: Effort normal and breath sounds normal. No respiratory distress.  Abdominal: Soft. There is no tenderness.  Musculoskeletal: She exhibits no edema.  Neurological: She is alert.  Skin: Skin is warm and dry.  Psychiatric: She has a normal mood and affect.  Nursing note and  vitals reviewed.    ED Treatments / Results  Labs (all labs ordered are listed, but only abnormal results are displayed) Labs Reviewed - No data to display  EKG  EKG Interpretation None       Radiology No results found.  Procedures Procedures (including critical care time)  Medications Ordered in ED Medications  ibuprofen (ADVIL,MOTRIN) tablet 800 mg (800 mg Oral Given 02/12/17 1926)     Initial Impression / Assessment and Plan / ED Course  I have reviewed the triage vital signs and the nursing notes.  Pertinent labs & imaging results that were available during my care of the patient were reviewed by me and  considered in my medical decision making (see chart for details).     Pt has normal exam.  Pt may have earlier viral illness.  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  She did have a flu shot  Final Clinical Impressions(s) / ED Diagnoses   Final diagnoses:  Body aches  Viral syndrome    ED Discharge Orders    None    An After Visit Summary was printed and given to the patient.   Sidney Ace 02/12/17 2339    Orlie Dakin, MD 02/13/17 [phone]

## 2017-02-12 NOTE — ED Triage Notes (Signed)
States he entire body is aching onset yesterday, denies fever

## 2017-02-12 NOTE — Discharge Instructions (Signed)
See your Physician for recheck in 2 days  °

## 2017-07-04 ENCOUNTER — Emergency Department (HOSPITAL_COMMUNITY)
Admission: EM | Admit: 2017-07-04 | Discharge: 2017-07-04 | Disposition: A | Discharge status: Home / Self Care | Payer: PRIVATE HEALTH INSURANCE | Attending: Emergency Medicine | Admitting: Emergency Medicine

## 2017-07-04 ENCOUNTER — Encounter (HOSPITAL_COMMUNITY): Payer: Self-pay | Admitting: Emergency Medicine

## 2017-07-04 ENCOUNTER — Emergency Department (HOSPITAL_COMMUNITY): Payer: PRIVATE HEALTH INSURANCE

## 2017-07-04 ENCOUNTER — Other Ambulatory Visit: Payer: Self-pay

## 2017-07-04 DIAGNOSIS — M542 Cervicalgia: Secondary | ICD-10-CM | POA: Diagnosis not present

## 2017-07-04 DIAGNOSIS — Z79899 Other long term (current) drug therapy: Secondary | ICD-10-CM | POA: Diagnosis not present

## 2017-07-04 DIAGNOSIS — F17200 Nicotine dependence, unspecified, uncomplicated: Secondary | ICD-10-CM | POA: Diagnosis not present

## 2017-07-04 DIAGNOSIS — I10 Essential (primary) hypertension: Secondary | ICD-10-CM | POA: Insufficient documentation

## 2017-07-04 DIAGNOSIS — H538 Other visual disturbances: Secondary | ICD-10-CM | POA: Diagnosis not present

## 2017-07-04 LAB — CBC WITH DIFFERENTIAL/PLATELET
BASOS ABS: 0 10*3/uL (ref 0.0–0.1)
BASOS PCT: 1 %
Eosinophils Absolute: 0.2 10*3/uL (ref 0.0–0.7)
Eosinophils Relative: 2 %
HEMATOCRIT: 40.1 % (ref 36.0–46.0)
Hemoglobin: 12.6 g/dL (ref 12.0–15.0)
LYMPHS PCT: 33 %
Lymphs Abs: 2.7 10*3/uL (ref 0.7–4.0)
MCH: 28.4 pg (ref 26.0–34.0)
MCHC: 31.4 g/dL (ref 30.0–36.0)
MCV: 90.5 fL (ref 78.0–100.0)
MONO ABS: 0.4 10*3/uL (ref 0.1–1.0)
MONOS PCT: 5 %
NEUTROS ABS: 4.8 10*3/uL (ref 1.7–7.7)
NEUTROS PCT: 59 %
Platelets: 312 10*3/uL (ref 150–400)
RBC: 4.43 MIL/uL (ref 3.87–5.11)
RDW: 16.3 % — AB (ref 11.5–15.5)
WBC: 8.1 10*3/uL (ref 4.0–10.5)

## 2017-07-04 LAB — BASIC METABOLIC PANEL
ANION GAP: 7 (ref 5–15)
BUN: 12 mg/dL (ref 6–20)
CO2: 27 mmol/L (ref 22–32)
Calcium: 9.1 mg/dL (ref 8.9–10.3)
Chloride: 106 mmol/L (ref 101–111)
Creatinine, Ser: 0.78 mg/dL (ref 0.44–1.00)
GFR calc non Af Amer: >60 mL/min (ref >60)
GLUCOSE: 94 mg/dL (ref 65–99)
POTASSIUM: 4.2 mmol/L (ref 3.5–5.1)
Sodium: 140 mmol/L (ref 135–145)

## 2017-07-04 LAB — TROPONIN I: Troponin I: <0.03 ng/mL (ref <0.03)

## 2017-07-04 NOTE — ED Provider Notes (Signed)
[facility] EMERGENCY DEPARTMENT Provider Note   CSN: [id number] Arrival date & time: 07/04/17  0900     History   Chief Complaint Chief Complaint  Patient presents with  . Hypertension    HPI Lindsay Shaw is a 37 y.o. female.  She is presenting today from work after seeing some visual spots and went to the nurse there and found her blood pressure to be elevated.  She is also noticing some sharp pain over her left clavicle and side of her neck.  She does not recall any trauma.  She is been to her PCP recently and was found there to have an elevated blood pressure and they were talking to her about may be starting on some meds.  Currently she does not take anything for blood pressure.  She denies any headache nausea vomiting chest pain shortness of breath fevers chills abdominal pain vomiting or diarrhea.  Visual symptoms have improved since arrival here.  She still having the sharp pain over her left clavicle.  The history is provided by the patient.  Hypertension  The current episode started 3 to 5 hours ago. The problem has been gradually improving. Pertinent negatives include no chest pain, no abdominal pain, no headaches and no shortness of breath. Nothing aggravates the symptoms. Nothing relieves the symptoms. She has tried nothing for the symptoms. The treatment provided mild relief.    History reviewed. No pertinent past medical history.  There are no active problems to display for this patient.   Past Surgical History:  Procedure Laterality Date  . ANKLE SURGERY    . APPENDECTOMY    . CESAREAN SECTION       OB History   None      Home Medications    Prior to Admission medications   Medication Sig Start Date End Date Taking? Authorizing Provider  cholecalciferol (VITAMIN D) 1000 UNITS tablet Take 2,000 Units by mouth daily.    [provider]  doxycycline (VIBRAMYCIN) 100 MG capsule Take 100 mg by mouth 2 (two) times daily.    [provider]    HYDROcodone-acetaminophen (NORCO/VICODIN) 5-325 MG tablet Take 1 tablet by mouth every 4 (four) hours as needed for moderate pain. 08/08/16   Orpah Greek, MD  HYDROcodone-acetaminophen (NORCO/VICODIN) 5-325 MG tablet Take 1-2 tablets by mouth every 4 (four) hours as needed. 08/08/16   Orpah Greek, MD  ibuprofen (ADVIL,MOTRIN) 600 MG tablet Take 1 tablet (600 mg total) by mouth every 6 (six) hours as needed. 07/09/16   Triplett, Tammy, PA-C  traMADol (ULTRAM) 50 MG tablet Take 1 tablet (50 mg total) by mouth every 6 (six) hours as needed. 07/09/16   Kem Parkinson, PA-C    Family History History reviewed. No pertinent family history.  Social History Social History   Tobacco Use  . Smoking status: Current Every Day Smoker    Packs/day: 1.00  . Smokeless tobacco: Never Used  Substance Use Topics  . Alcohol use: Yes    Comment: Occ  . Drug use: No     Allergies   Patient has no known allergies.   Review of Systems Review of Systems  Constitutional: Negative for chills and fever.  HENT: Negative for rhinorrhea and sore throat.   Eyes: Positive for visual disturbance. Negative for pain.  Respiratory: Negative for shortness of breath.   Cardiovascular: Negative for chest pain.  Gastrointestinal: Negative for abdominal pain, nausea and vomiting.  Genitourinary: Negative for dysuria.  Musculoskeletal: Positive for neck pain.  Negative for back pain and gait problem.  Skin: Negative for rash.  Neurological: Negative for dizziness, seizures, syncope, speech difficulty, numbness and headaches.     Physical Exam Updated Vital Signs BP (!) 155/87 (BP Location: Left Arm)   Temp 98.5 F (36.9 C) (Oral)   Resp 16   SpO2 100%   Physical Exam  Constitutional: She appears well-developed and well-nourished. No distress.  HENT:  Head: Normocephalic and atraumatic.  Eyes: Conjunctivae are normal.  Neck: Neck supple.  Cardiovascular: Normal rate and regular rhythm.   No murmur heard. Pulmonary/Chest: Effort normal and breath sounds normal. No respiratory distress.  There is some reproducible tenderness around her left distal clavicle and trapezius.  There is no overlying erythema no crepitus.  Abdominal: Soft. There is no tenderness.  Musculoskeletal: She exhibits no edema, tenderness or deformity.  Neurological: She is alert.  Skin: Skin is warm and dry. Capillary refill takes less than 2 seconds.  Psychiatric: She has a normal mood and affect.  Nursing note and vitals reviewed.    ED Treatments / Results  Labs (all labs ordered are listed, but only abnormal results are displayed) Labs Reviewed  CBC WITH DIFFERENTIAL/PLATELET - Abnormal; Notable for the following components:      Result Value   RDW 16.3 (*)    All other components within normal limits  BASIC METABOLIC PANEL  TROPONIN I    EKG EKG Interpretation  Date/Time:  Wednesday July 04 2017 09:23:11 EDT Ventricular Rate:  72 PR Interval:  178 QRS Duration: 76 QT Interval:  400 QTC Calculation: 438 R Axis:   -7 Text Interpretation:  Normal sinus rhythm Possible Anterior infarct , age undetermined Abnormal ECG no prior to compare with Confirmed by Aletta Edouard [phone]) on 07/04/2017 9:39:42 AM   Radiology Dg Chest 2 View  Result Date: 07/04/2017 CLINICAL DATA:  Chest pain EXAM: CHEST - 2 VIEW COMPARISON:  None. FINDINGS: The heart size and mediastinal contours are within normal limits. Both lungs are clear. The visualized skeletal structures are unremarkable. IMPRESSION: No active cardiopulmonary disease. Electronically Signed   By: Franchot Gallo M.D.   On: 07/04/2017 11:21    Procedures Procedures (including critical care time)  Medications Ordered in ED Medications - No data to display   Initial Impression / Assessment and Plan / ED Course  I have reviewed the triage vital signs and the nursing notes.  Pertinent labs & imaging results that were available during my  care of the patient were reviewed by me and considered in my medical decision making (see chart for details).  Clinical Course as of Jul 05 913  Wed Jul 04, 2017  1145 Reviewed the results of patient's lab work EKG chest x-ray with her.  Her symptoms have been improved here.  She understands to follow-up with her primary care doctor regarding discussion on starting some medication for high blood pressure.  She will return if any worsening symptoms.   [MB]    Clinical Course User Index [MB] Hayden Rasmussen, MD     Final Clinical Impressions(s) / ED Diagnoses   Final diagnoses:  Hypertension, unspecified type  Blurry vision  Neck pain on left side    ED Discharge Orders    None       Hayden Rasmussen, MD 07/05/17 [phone]

## 2017-07-04 NOTE — ED Triage Notes (Signed)
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 Pt states took BP at work and was elevated twice. Pt presents with continued discomfort around neck. Pt reports same pain behind right knee  Since weekend. Denies SOB or other symptoms

## 2017-07-04 NOTE — Discharge Instructions (Addendum)
Your evaluated in the emergency department for some blurry vision and left neck pain in the setting of an elevated blood pressure.  We ran some blood test and EKG chest x-ray did not find an obvious cause of your symptoms.  Your blood pressure was improved here in your symptoms were improving also.  It is important that you follow-up with your primary care doctor and have a discussion regarding starting some medications.  Return here if any problems.

## 2017-07-04 NOTE — ED Notes (Signed)
Pt is complaining of left sided neck pain as well as right thigh pain. States pain is sharp. Have gotten a tech to do an EKG

## 2018-02-19 ENCOUNTER — Other Ambulatory Visit (HOSPITAL_COMMUNITY): Payer: Self-pay | Admitting: Internal Medicine

## 2018-02-19 DIAGNOSIS — Z1231 Encounter for screening mammogram for malignant neoplasm of breast: Secondary | ICD-10-CM

## 2018-02-22 ENCOUNTER — Ambulatory Visit (HOSPITAL_COMMUNITY): Payer: PRIVATE HEALTH INSURANCE

## 2018-04-03 ENCOUNTER — Encounter: Payer: Self-pay | Admitting: Adult Health

## 2018-04-03 ENCOUNTER — Other Ambulatory Visit: Payer: Self-pay | Admitting: Adult Health

## 2018-05-09 ENCOUNTER — Other Ambulatory Visit: Payer: PRIVATE HEALTH INSURANCE | Admitting: Adult Health

## 2018-07-01 ENCOUNTER — Telehealth: Payer: Self-pay | Admitting: *Deleted

## 2018-07-01 NOTE — Telephone Encounter (Signed)
Called patient to give covid restrictions pt stated that she needs to reschedule. Appointment changed per patient request.

## 2018-07-02 ENCOUNTER — Other Ambulatory Visit: Payer: PRIVATE HEALTH INSURANCE | Admitting: Adult Health

## 2018-07-10 ENCOUNTER — Telehealth: Payer: Self-pay | Admitting: Adult Health

## 2018-07-10 NOTE — Telephone Encounter (Signed)

## 2018-07-11 ENCOUNTER — Other Ambulatory Visit: Payer: PRIVATE HEALTH INSURANCE | Admitting: Adult Health

## 2018-09-18 ENCOUNTER — Other Ambulatory Visit: Payer: PRIVATE HEALTH INSURANCE | Admitting: Adult Health

## 2018-10-16 ENCOUNTER — Emergency Department (HOSPITAL_COMMUNITY)
Admission: EM | Admit: 2018-10-16 | Discharge: 2018-10-16 | Disposition: A | Discharge status: Home / Self Care | Payer: PRIVATE HEALTH INSURANCE | Attending: Emergency Medicine | Admitting: Emergency Medicine

## 2018-10-16 ENCOUNTER — Other Ambulatory Visit: Payer: Self-pay

## 2018-10-16 ENCOUNTER — Emergency Department (HOSPITAL_COMMUNITY): Payer: PRIVATE HEALTH INSURANCE

## 2018-10-16 ENCOUNTER — Encounter (HOSPITAL_COMMUNITY): Payer: Self-pay | Admitting: *Deleted

## 2018-10-16 DIAGNOSIS — Y9389 Activity, other specified: Secondary | ICD-10-CM | POA: Diagnosis not present

## 2018-10-16 DIAGNOSIS — Y999 Unspecified external cause status: Secondary | ICD-10-CM | POA: Insufficient documentation

## 2018-10-16 DIAGNOSIS — M545 Low back pain, unspecified: Secondary | ICD-10-CM

## 2018-10-16 DIAGNOSIS — M542 Cervicalgia: Secondary | ICD-10-CM | POA: Diagnosis not present

## 2018-10-16 DIAGNOSIS — Y929 Unspecified place or not applicable: Secondary | ICD-10-CM | POA: Insufficient documentation

## 2018-10-16 DIAGNOSIS — Z79899 Other long term (current) drug therapy: Secondary | ICD-10-CM | POA: Diagnosis not present

## 2018-10-16 DIAGNOSIS — F17210 Nicotine dependence, cigarettes, uncomplicated: Secondary | ICD-10-CM | POA: Insufficient documentation

## 2018-10-16 DIAGNOSIS — S199XXA Unspecified injury of neck, initial encounter: Secondary | ICD-10-CM | POA: Diagnosis present

## 2018-10-16 DIAGNOSIS — M546 Pain in thoracic spine: Secondary | ICD-10-CM | POA: Diagnosis not present

## 2018-10-16 DIAGNOSIS — M549 Dorsalgia, unspecified: Secondary | ICD-10-CM

## 2018-10-16 LAB — POC URINE PREG, ED
Preg Test, Ur: NEGATIVE
Preg Test, Ur: NEGATIVE

## 2018-10-16 MED ORDER — NAPROXEN 250 MG PO TABS
500.0000 mg | ORAL_TABLET | Freq: Once | ORAL | Status: AC
  Administered 2018-10-16: 500 mg via ORAL
  Filled 2018-10-16: qty 2

## 2018-10-16 MED ORDER — CYCLOBENZAPRINE HCL 10 MG PO TABS
5.0000 mg | ORAL_TABLET | Freq: Once | ORAL | Status: AC
  Administered 2018-10-16: 5 mg via ORAL
  Filled 2018-10-16: qty 1

## 2018-10-16 MED ORDER — NAPROXEN 500 MG PO TABS: 500.0000 mg | ORAL_TABLET | Freq: Two times a day (BID) | ORAL | 0 refills | Status: DC

## 2018-10-16 MED ORDER — CYCLOBENZAPRINE HCL 5 MG PO TABS: 5.0000 mg | ORAL_TABLET | Freq: Three times a day (TID) | ORAL | 0 refills | Status: DC | PRN

## 2018-10-16 NOTE — Discharge Instructions (Addendum)
Ice packs to the injured or sore muscles for the next several days then start using heat. Take the medications for pain and muscle spasms. Return to the ED for any problems listed on the head injury sheet. Recheck if you aren't improving in the next week. ° °
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
## 2018-10-16 NOTE — ED Notes (Signed)
Pt assisted to bathroom and back to bed 
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
## 2018-10-18 ENCOUNTER — Telehealth: Payer: Self-pay | Admitting: Adult Health

## 2018-10-18 NOTE — Telephone Encounter (Signed)
Called patient regarding appointment scheduled in our office encouraged to come alone to the visit if possible, however, a support person, over age 40, may accompany her  to appointment if assistance is needed for safety or care concerns. Otherwise, support persons should remain outside until the visit is complete.  ° °We ask if you have had any exposure to anyone suspected or confirmed of having COVID-19 or if you are experiencing any of the following, to call and reschedule your appointment: fever, cough, shortness of breath, muscle pain, diarrhea, rash, vomiting, abdominal pain, red eye, weakness, bruising, bleeding, joint pain, or a severe headache.  ° °Please know we will ask you these questions or similar questions when you arrive for your appointment and again it’s how we are keeping everyone safe.   ° °Also,to keep you safe, please use the provided hand sanitizer when you enter the office. We are asking everyone in the office to wear a mask to help prevent the spread of °germs. If you have a mask of your own, please wear it to your appointment, if not, we are happy to provide one for you. ° °Thank you for understanding and your cooperation.  ° ° °[facility] Staff ° ° ° °

## 2018-10-21 ENCOUNTER — Other Ambulatory Visit: Payer: Self-pay

## 2018-10-21 ENCOUNTER — Encounter: Payer: Self-pay | Admitting: Adult Health

## 2018-10-21 ENCOUNTER — Ambulatory Visit (INDEPENDENT_AMBULATORY_CARE_PROVIDER_SITE_OTHER): Payer: Self-pay | Admitting: Adult Health

## 2018-10-21 ENCOUNTER — Other Ambulatory Visit (HOSPITAL_COMMUNITY)
Admission: RE | Admit: 2018-10-21 | Discharge: 2018-10-21 | Disposition: A | Discharge status: Home / Self Care | Payer: PRIVATE HEALTH INSURANCE | Source: Ambulatory Visit | Attending: Adult Health | Admitting: Adult Health

## 2018-10-21 VITALS — BP 148/102 | HR 85 | Ht 63.75 in | Wt 223.0 lb

## 2018-10-21 DIAGNOSIS — Z1211 Encounter for screening for malignant neoplasm of colon: Secondary | ICD-10-CM | POA: Diagnosis not present

## 2018-10-21 DIAGNOSIS — Z01419 Encounter for gynecological examination (general) (routine) without abnormal findings: Secondary | ICD-10-CM | POA: Insufficient documentation

## 2018-10-21 DIAGNOSIS — R1031 Right lower quadrant pain: Secondary | ICD-10-CM

## 2018-10-21 DIAGNOSIS — I10 Essential (primary) hypertension: Secondary | ICD-10-CM | POA: Insufficient documentation

## 2018-10-21 DIAGNOSIS — Z1212 Encounter for screening for malignant neoplasm of rectum: Secondary | ICD-10-CM | POA: Diagnosis not present

## 2018-10-21 DIAGNOSIS — G8929 Other chronic pain: Secondary | ICD-10-CM

## 2018-10-21 LAB — HEMOCCULT GUIAC POC 1CARD (OFFICE): Fecal Occult Blood, POC: NEGATIVE

## 2018-10-21 MED ORDER — AMLODIPINE BESYLATE 5 MG PO TABS: 5.0000 mg | ORAL_TABLET | Freq: Every day | ORAL | 3 refills | Status: DC

## 2018-10-21 MED ORDER — KETOROLAC TROMETHAMINE 10 MG PO TABS: 10.0000 mg | ORAL_TABLET | Freq: Four times a day (QID) | ORAL | 0 refills | Status: DC | PRN
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
## 2018-10-21 NOTE — Patient Instructions (Signed)

## 2018-10-25 ENCOUNTER — Telehealth: Payer: Self-pay | Admitting: Obstetrics & Gynecology

## 2018-10-25 LAB — CYTOLOGY - PAP
Diagnosis: NEGATIVE
High risk HPV: NEGATIVE
Molecular Disclaimer: 56
Molecular Disclaimer: NORMAL

## 2018-10-25 NOTE — Telephone Encounter (Signed)

## 2018-10-28 ENCOUNTER — Ambulatory Visit (INDEPENDENT_AMBULATORY_CARE_PROVIDER_SITE_OTHER): Payer: PRIVATE HEALTH INSURANCE

## 2018-10-28 ENCOUNTER — Other Ambulatory Visit: Payer: Self-pay

## 2018-10-28 ENCOUNTER — Other Ambulatory Visit: Payer: Self-pay | Admitting: Adult Health

## 2018-10-28 DIAGNOSIS — G8929 Other chronic pain: Secondary | ICD-10-CM

## 2018-10-28 DIAGNOSIS — N83202 Unspecified ovarian cyst, left side: Secondary | ICD-10-CM | POA: Diagnosis not present

## 2018-10-28 DIAGNOSIS — N83201 Unspecified ovarian cyst, right side: Secondary | ICD-10-CM | POA: Diagnosis not present

## 2018-10-28 DIAGNOSIS — R1031 Right lower quadrant pain: Secondary | ICD-10-CM

## 2018-10-28 NOTE — Progress Notes (Signed)
PELVIC US TA/TV: Anteverted/retroverted uterus with a fundal right pedunculated fibroid 3.1 x 2.9 x 3.7 cm,EEC 14.6 mm,two simple right ovarian cysts (#1) 6.6 x 3.6 x 3.1 cm (#2) 2.3 x 1.5 x 2.2 cm, three left ovarian cysts (#1) 4.3 x 4 x 4.4 cm complex cyst with low level internal echoes (? Endometrioma),(#2) 1.9 x 1.7 x 1.9 cm hemorrhagic cyst, (#3) simple cyst 5.7 x 5.4 x 5.8 cm,no free fluid,some discomfort during ultrasound,ovaries appear mobile with bilat arterial and venous flow visualized

## 2018-10-30 ENCOUNTER — Other Ambulatory Visit: Payer: PRIVATE HEALTH INSURANCE | Admitting: Obstetrics and Gynecology

## 2018-10-30 ENCOUNTER — Telehealth: Payer: Self-pay | Admitting: Adult Health

## 2018-10-30 ENCOUNTER — Encounter

## 2018-10-30 NOTE — Telephone Encounter (Signed)

## 2018-10-31 ENCOUNTER — Ambulatory Visit (INDEPENDENT_AMBULATORY_CARE_PROVIDER_SITE_OTHER): Payer: PRIVATE HEALTH INSURANCE | Admitting: Adult Health

## 2018-10-31 ENCOUNTER — Encounter: Payer: Self-pay | Admitting: Adult Health

## 2018-10-31 ENCOUNTER — Other Ambulatory Visit: Payer: Self-pay

## 2018-10-31 VITALS — BP 115/75 | HR 85 | Ht 63.0 in | Wt 218.0 lb

## 2018-10-31 DIAGNOSIS — N83201 Unspecified ovarian cyst, right side: Secondary | ICD-10-CM | POA: Diagnosis not present

## 2018-10-31 DIAGNOSIS — N80129 Deep endometriosis of ovary, unspecified ovary: Secondary | ICD-10-CM

## 2018-10-31 DIAGNOSIS — I10 Essential (primary) hypertension: Secondary | ICD-10-CM

## 2018-10-31 DIAGNOSIS — D219 Benign neoplasm of connective and other soft tissue, unspecified: Secondary | ICD-10-CM | POA: Diagnosis not present

## 2018-10-31 DIAGNOSIS — R1031 Right lower quadrant pain: Secondary | ICD-10-CM | POA: Diagnosis not present

## 2018-10-31 DIAGNOSIS — G8929 Other chronic pain: Secondary | ICD-10-CM

## 2018-10-31 DIAGNOSIS — N801 Endometriosis of ovary: Secondary | ICD-10-CM | POA: Diagnosis not present

## 2018-10-31 DIAGNOSIS — N83202 Unspecified ovarian cyst, left side: Secondary | ICD-10-CM

## 2018-10-31 MED ORDER — MEGESTROL ACETATE 40 MG PO TABS: 40.0000 mg | ORAL_TABLET | Freq: Every day | ORAL | 3 refills | Status: DC

## 2018-10-31 NOTE — Patient Instructions (Signed)
Ovarian Cyst     An ovarian cyst is a fluid-filled sac that forms on an ovary. The ovaries are small organs that produce eggs in women. Various types of cysts can form on the ovaries. Some may cause symptoms and require treatment. Most ovarian cysts go away on their own, are not cancerous (are benign), and do not cause problems. Common types of ovarian cysts include:  Functional (follicle) cysts. ? Occur during the menstrual cycle, and usually go away with the next menstrual cycle if you do not get pregnant. ? Usually cause no symptoms.  Endometriomas. ? Are cysts that form from the tissue that lines the uterus (endometrium). ? Are sometimes called "chocolate cysts" because they become filled with blood that turns brown. ? Can cause pain in the lower abdomen during intercourse and during your period.  Cystadenoma cysts. ? Develop from cells on the outside surface of the ovary. ? Can get very large and cause lower abdomen pain and pain with intercourse. ? Can cause severe pain if they twist or break open (rupture).  Dermoid cysts. ? Are sometimes found in both ovaries. ? May contain different kinds of body tissue, such as skin, teeth, hair, or cartilage. ? Usually do not cause symptoms unless they get very big.  Theca lutein cysts. ? Occur when too much of a certain hormone (human chorionic gonadotropin) is produced and overstimulates the ovaries to produce an egg. ? Are most common after having procedures used to assist with the conception of a baby (in vitro fertilization). What are the causes? Ovarian cysts may be caused by:  Ovarian hyperstimulation syndrome. This is a condition that can develop from taking fertility medicines. It causes multiple large ovarian cysts to form.  Polycystic ovarian syndrome (PCOS). This is a common hormonal disorder that can cause ovarian cysts, as well as problems with your period or fertility. What increases the risk? The following factors may  make you more likely to develop ovarian cysts:  Being overweight or obese.  Taking fertility medicines.  Taking certain forms of hormonal birth control.  Smoking. What are the signs or symptoms? Many ovarian cysts do not cause symptoms. If symptoms are present, they may include:  Pelvic pain or pressure.  Pain in the lower abdomen.  Pain during sex.  Abdominal swelling.  Abnormal menstrual periods.  Increasing pain with menstrual periods. How is this diagnosed? These cysts are commonly found during a routine pelvic exam. You may have tests to find out more about the cyst, such as:  Ultrasound.  X-ray of the pelvis.  CT scan.  MRI.  Blood tests. How is this treated? Many ovarian cysts go away on their own without treatment. Your health care provider may want to check your cyst regularly for 2-3 months to see if it changes. If you are in menopause, it is especially important to have your cyst monitored closely because menopausal women have a higher rate of ovarian cancer. When treatment is needed, it may include:  Medicines to help relieve pain.  A procedure to drain the cyst (aspiration).  Surgery to remove the whole cyst.  Hormone treatment or birth control pills. These methods are sometimes used to help dissolve a cyst. Follow these instructions at home:  Take over-the-counter and prescription medicines only as told by your health care provider.  Do not drive or use heavy machinery while taking prescription pain medicine.  Get regular pelvic exams and Pap tests as often as told by your health care provider.    Return to your normal activities as told by your health care provider. Ask your health care provider what activities are safe for you.  Do not use any products that contain nicotine or tobacco, such as cigarettes and e-cigarettes. If you need help quitting, ask your health care provider.  Keep all follow-up visits as told by your health care provider.  This is important. Contact a health care provider if:  Your periods are late, irregular, or painful, or they stop.  You have pelvic pain that does not go away.  You have pressure on your bladder or trouble emptying your bladder completely.  You have pain during sex.  You have any of the following in your abdomen: ? A feeling of fullness. ? Pressure. ? Discomfort. ? Pain that does not go away. ? Swelling.  You feel generally ill.  You become constipated.  You lose your appetite.  You develop severe acne.  You start to have more body hair and facial hair.  You are gaining weight or losing weight without changing your exercise and eating habits.  You think you may be pregnant. Get help right away if:  You have abdominal pain that is severe or gets worse.  You cannot eat or drink without vomiting.  You suddenly develop a fever.  Your menstrual period is much heavier than usual. This information is not intended to replace advice given to you by your health care provider. Make sure you discuss any questions you have with your health care provider. Document Released: 01/09/2005 Document Revised: 04/09/2017 Document Reviewed: 06/13/2015 Elsevier Patient Education  2020 [employer]. Uterine Fibroids  Uterine fibroids (leiomyomas) are noncancerous (benign) tumors that can develop in the uterus. Fibroids may also develop in the fallopian tubes, cervix, or tissues (ligaments) near the uterus. You may have one or many fibroids. Fibroids vary in size, weight, and where they grow in the uterus. Some can become quite large. Most fibroids do not require medical treatment. What are the causes? The cause of this condition is not known. What increases the risk? You are more likely to develop this condition if you:  Are in your 30s or 40s and have not gone through menopause.  Have a family history of this condition.  Are of African-American descent.  Had your first period at an  early age (early menarche).  Have not had any children (nulliparity).  Are overweight or obese. What are the signs or symptoms? Many women do not have any symptoms. Symptoms of this condition may include:  Heavy menstrual bleeding.  Bleeding or spotting between periods.  Pain and pressure in the pelvic area, between the hips.  Bladder problems, such as needing to urinate urgently or more often than usual.  Inability to have children (infertility).  Failure to carry pregnancy to term (miscarriage). How is this diagnosed? This condition may be diagnosed based on:  Your symptoms and medical history.  A physical exam.  A pelvic exam that includes feeling for any tumors.  Imaging tests, such as ultrasound or MRI. How is this treated? Treatment for this condition may include:  Seeing your health care provider for follow-up visits to monitor your fibroids for any changes.  Taking NSAIDs such as ibuprofen, naproxen, or aspirin to reduce pain.  Hormone medicines. These may be taken as a pill, given in an injection, or delivered by a T-shaped device that is inserted into the uterus (intrauterine device, IUD).  Surgery to remove one of the following: ? The fibroids (myomectomy). Your  health care provider may recommend this if fibroids affect your fertility and you want to become pregnant. ? The uterus (hysterectomy). ? Blood supply to the fibroids (uterine artery embolization). Follow these instructions at home:  Take over-the-counter and prescription medicines only as told by your health care provider.  Ask your health care provider if you should take iron pills or eat more iron-rich foods, such as dark green, leafy vegetables. Heavy menstrual bleeding can cause low iron levels.  If directed, apply heat to your back or abdomen to reduce pain. Use the heat source that your health care provider recommends, such as a moist heat pack or a heating pad. ? Place a towel between your  skin and the heat source. ? Leave the heat on for 20-30 minutes. ? Remove the heat if your skin turns bright red. This is especially important if you are unable to feel pain, heat, or cold. You may have a greater risk of getting burned.  Pay close attention to your menstrual cycle. Tell your health care provider about any changes, such as: ? Increased blood flow that requires you to use more pads or tampons than usual. ? A change in the number of days that your period lasts. ? A change in symptoms that are associated with your period, such as back pain or cramps in your abdomen.  Keep all follow-up visits as told by your health care provider. This is important, especially if your fibroids need to be monitored for any changes. Contact a health care provider if you:  Have pelvic pain, back pain, or cramps in your abdomen that do not get better with medicine or heat.  Develop new bleeding between periods.  Have increased bleeding during or between periods.  Feel unusually tired or weak.  Feel light-headed. Get help right away if you:  Faint.  Have pelvic pain that suddenly gets worse.  Have severe vaginal bleeding that soaks a tampon or pad in 30 minutes or less. Summary  Uterine fibroids are noncancerous (benign) tumors that can develop in the uterus.  The exact cause of this condition is not known.  Most fibroids do not require medical treatment unless they affect your ability to have children (fertility).  Contact a health care provider if you have pelvic pain, back pain, or cramps in your abdomen that do not get better with medicines.  Make sure you know what symptoms should cause you to get help right away. This information is not intended to replace advice given to you by your health care provider. Make sure you discuss any questions you have with your health care provider. Document Released: 01/07/2000 Document Revised: 12/22/2016 Document Reviewed: 12/05/2016 Elsevier  Patient Education  2020 [employer].

## 2018-10-31 NOTE — Progress Notes (Signed)
  Subjective:     Patient ID: Lindsay Shaw, female   DOB: 07-30-1980, 38 y.o.   MRN: [id number]  HPI Lindsay Shaw is a 38 year old black female, G3P3 in for BP Check and to review [place]. PCP is [facility].   Review of Systems Still has some pain RLQ    Reviewed past medical,surgical, social and family history. Reviewed medications and allergies.  Objective:   Physical Exam BP 115/75 (BP Location: Left Arm, Patient Position: Sitting, Cuff Size: Normal)   Pulse 85   Ht 5\' 3"  (1.6 m)   Wt 218 lb (98.9 kg)   LMP 10/07/2018   BMI 38.62 kg/m Talk Only:  Has lost 5 lbs and BP is good.  Reviewed US:Has 3 cysts left ovary and 2 on right ?endomertioma and has fundal fibroid, will check CA 125 and rx megace to try to  Suppress ovaries and recheck [place] in 3 months.  Face time 10 minutes talking and explaining.     Assessment:     1. Bilateral ovarian cysts   2. Chronic RLQ pain   3. Fibroid   4. Endometrioma of ovary   5. Hypertension, unspecified type       Plan:     Check CA 125 today, will talke in results back  Will rx megace Continue norvasc has refills Meds ordered this encounter  Medications  . megestrol (MEGACE) 40 MG tablet    Sig: Take 1 tablet (40 mg total) by mouth daily.    Dispense:  30 tablet    Refill:  3    Order Specific Question:   Supervising Provider    Answer:   Florian Buff [[id number]]  Return in 3 months for follow up US and see me Review handouts on ovary cysts and fibroids   If cysts persists may need to have removed

## 2018-11-01 ENCOUNTER — Telehealth: Payer: Self-pay | Admitting: Adult Health

## 2018-11-01 LAB — CA 125: Cancer Antigen (CA) 125: 25 U/mL (ref 0.0–38.1)

## 2018-11-01 NOTE — Telephone Encounter (Signed)
Pt aware that CA 125 was normal

## 2018-11-15 ENCOUNTER — Telehealth: Payer: Self-pay | Admitting: *Deleted

## 2018-11-15 NOTE — Telephone Encounter (Signed)
Patient left message on nurse line that she started her period last Friday and is still on it today. The pills are not working.

## 2018-11-18 NOTE — Telephone Encounter (Signed)
Pt informed of how to take Megace, advised if not better after that to let us know.

## 2018-11-21 ENCOUNTER — Telehealth: Payer: Self-pay | Admitting: *Deleted

## 2018-11-21 NOTE — Telephone Encounter (Signed)
Pt left message that she is bleeding heavier and the med is not working for her. Wants to do something different.

## 2018-11-22 NOTE — Telephone Encounter (Signed)
Megace 40 mg is not working, bleeding heavy, so take 3 x 5 days then 2 x 5 days and let me know next week if better or not, could consider IUD or ablation

## 2018-11-26 ENCOUNTER — Telehealth: Payer: Self-pay | Admitting: *Deleted

## 2018-11-26 NOTE — Telephone Encounter (Signed)
Pt left message that she is calling because she is having the same problems as she's been having with that medicine. Would like a call back.

## 2018-11-27 NOTE — Telephone Encounter (Signed)
Pt is having trouble sleeping and is having vaginal bleeding. Period started 10/16 and hasn't stopped. Started Megace on 10/8. Taking med 3 times a day didn't even stop the bleeding. Pt feels drained. Pt is having medium flow. Please advise. Thanks!! [place]

## 2018-11-27 NOTE — Telephone Encounter (Signed)
Pt couldn't come today. She has had no sleep and don't trust herself to drive. Pt wants to come Friday. Call transferred to [facility] for appt. [place]

## 2018-11-28 ENCOUNTER — Telehealth: Payer: Self-pay | Admitting: Obstetrics and Gynecology

## 2018-11-28 NOTE — Telephone Encounter (Signed)

## 2018-11-29 ENCOUNTER — Encounter: Payer: Self-pay | Admitting: Obstetrics and Gynecology

## 2018-11-29 ENCOUNTER — Ambulatory Visit (INDEPENDENT_AMBULATORY_CARE_PROVIDER_SITE_OTHER): Payer: PRIVATE HEALTH INSURANCE | Admitting: Obstetrics and Gynecology

## 2018-11-29 ENCOUNTER — Other Ambulatory Visit: Payer: Self-pay

## 2018-11-29 DIAGNOSIS — N938 Other specified abnormal uterine and vaginal bleeding: Secondary | ICD-10-CM | POA: Diagnosis not present

## 2018-11-29 MED ORDER — NORETHINDRONE ACETATE 5 MG PO TABS: 5.0000 mg | ORAL_TABLET | Freq: Every day | ORAL | 2 refills | Status: DC

## 2018-11-29 NOTE — Progress Notes (Signed)
[name] presents today with continued c/o DUB. See prior office visits and U/S. Was started on Megace 10/31/18. Has been having some degree of bleeding since starting LMP 11/07/18. She was instructed to increase dosage and she still noted no difference. She stop taking a week ago. Reports changing pads 6 times a day  PE AF VSS Lungs clear Heart RRR Abd soft + BS  A/P DUB       Uterine fibroid        Bilateral Ovarian cysts, ? Endometrioma  Will try Aygestin 5 mg qd. F/U in 2 weeks to gage response.

## 2018-12-13 ENCOUNTER — Ambulatory Visit: Payer: PRIVATE HEALTH INSURANCE | Admitting: Adult Health

## 2019-01-30 ENCOUNTER — Other Ambulatory Visit: Payer: PRIVATE HEALTH INSURANCE

## 2019-01-30 ENCOUNTER — Ambulatory Visit: Payer: PRIVATE HEALTH INSURANCE | Admitting: Adult Health

## 2019-02-12 ENCOUNTER — Other Ambulatory Visit: Payer: PRIVATE HEALTH INSURANCE

## 2019-02-12 ENCOUNTER — Ambulatory Visit: Payer: PRIVATE HEALTH INSURANCE | Admitting: Adult Health

## 2019-02-19 ENCOUNTER — Ambulatory Visit (INDEPENDENT_AMBULATORY_CARE_PROVIDER_SITE_OTHER): Payer: PRIVATE HEALTH INSURANCE

## 2019-02-19 ENCOUNTER — Encounter: Payer: Self-pay | Admitting: Adult Health

## 2019-02-19 ENCOUNTER — Ambulatory Visit (INDEPENDENT_AMBULATORY_CARE_PROVIDER_SITE_OTHER): Payer: PRIVATE HEALTH INSURANCE | Admitting: Adult Health

## 2019-02-19 ENCOUNTER — Other Ambulatory Visit: Payer: Self-pay

## 2019-02-19 VITALS — BP 165/107 | HR 70 | Ht 63.0 in | Wt 223.5 lb

## 2019-02-19 DIAGNOSIS — I10 Essential (primary) hypertension: Secondary | ICD-10-CM

## 2019-02-19 DIAGNOSIS — G8929 Other chronic pain: Secondary | ICD-10-CM

## 2019-02-19 DIAGNOSIS — R1031 Right lower quadrant pain: Secondary | ICD-10-CM

## 2019-02-19 DIAGNOSIS — N801 Endometriosis of ovary: Secondary | ICD-10-CM

## 2019-02-19 DIAGNOSIS — D219 Benign neoplasm of connective and other soft tissue, unspecified: Secondary | ICD-10-CM

## 2019-02-19 DIAGNOSIS — N83201 Unspecified ovarian cyst, right side: Secondary | ICD-10-CM

## 2019-02-19 DIAGNOSIS — N80129 Deep endometriosis of ovary, unspecified ovary: Secondary | ICD-10-CM

## 2019-02-19 DIAGNOSIS — N83202 Unspecified ovarian cyst, left side: Secondary | ICD-10-CM

## 2019-02-19 MED ORDER — AMLODIPINE BESYLATE 5 MG PO TABS: 5.0000 mg | ORAL_TABLET | Freq: Every day | ORAL | 3 refills | Status: DC

## 2019-02-19 MED ORDER — NORETHINDRONE ACETATE 5 MG PO TABS: 5.0000 mg | ORAL_TABLET | Freq: Every day | ORAL | 2 refills | Status: DC

## 2019-02-19 NOTE — Progress Notes (Signed)
  Subjective:     Patient ID: Lindsay Shaw, female   DOB: 10-03-1980, 39 y.o.   MRN: [id number]  HPI Lindsay Shaw is a 39 year old black female, in for follow up on [place] has had DUB, fiborid and ovarian cysts.Had CA 125 of 25 on 10/31/18. No bleeding on aygestin til last week, still has pain.  PCP [facility]   Review of Systems Had no bleeding til last week on aygestin Still has pain Has not had BP nmeds in a month or so  Reviewed past medical,surgical, social and family history. Reviewed medications and allergies.     Objective:   Physical Exam BP (!) 165/107 (BP Location: Left Arm, Patient Position: Sitting, Cuff Size: Large)   Pulse 70   Ht 5\' 3"  (1.6 m)   Wt 223 lb 8 oz (101.4 kg)   LMP 02/14/2019   BMI 39.59 kg/m Fall risk is low.Talk only. Review [place] with pt. Uterus enlarged hs several fibroids and EEC 5 mm, has left ovarian cyst 5.2 x 4.3 x 4 cm(endometrioma) and another that is 6.2 x 4.9 x 6.2 that is complex, and then on right ovary has tubular cystic structure 6.5 x 1.9 x 3. 7 cms. I will refer to Dr Elonda Husky for consult.     Assessment:     1. Fibroid  2. Endometrioma of ovary  3. Bilateral ovarian cysts  4. Hypertension, unspecified type Get back on Norvasc Meds ordered this encounter  Medications  . amLODipine (NORVASC) 5 MG tablet    Sig: Take 1 tablet (5 mg total) by mouth daily.    Dispense:  30 tablet    Refill:  3    Order Specific Question:   Supervising Provider    Answer:   Elonda Husky, LUTHER H [[id number]]  . norethindrone (AYGESTIN) 5 MG tablet    Sig: Take 1 tablet (5 mg total) by mouth daily.    Dispense:  30 tablet    Refill:  2    Order Specific Question:   Supervising Provider    Answer:   Tania Ade H [[id number]]  Continue Ayestin  5. Chronic RLQ pain     Plan:     Return to see Dr Elonda Husky, about possible surgery

## 2019-02-19 NOTE — Progress Notes (Signed)
PELVIC US TA/TV: heterogeneous anteverted/retroverted uterus,fundal right pedunculated fibroid (#1) 2.8 x 2.9 x 2.9 cm,(#2) posterior fundal fibroid 2.9 x 2.6 x 3.2 cm, EEC 5.1 mm, two left ovarian cysts (#1)  4 x 4.3 x 2.1 cm complex cyst with low level internal echoes (? Endometrioma),(#2) complex left ovarian cyst (limited view) 6.2 x 4.9 x 6.2 cm,simple tubular cystic structure right adnexa (tube vs ovarian cyst) 6.5 x 1.9 x 3.7 cm,no free fluid,ovaries appear mobile,left adnexal pain during ultrasound

## 2019-02-27 ENCOUNTER — Other Ambulatory Visit: Payer: Self-pay

## 2019-02-27 ENCOUNTER — Encounter: Payer: Self-pay | Admitting: Obstetrics & Gynecology

## 2019-02-27 ENCOUNTER — Ambulatory Visit (INDEPENDENT_AMBULATORY_CARE_PROVIDER_SITE_OTHER): Payer: PRIVATE HEALTH INSURANCE | Admitting: Obstetrics & Gynecology

## 2019-02-27 VITALS — BP 136/95 | HR 84 | Ht 63.0 in | Wt 222.0 lb

## 2019-02-27 DIAGNOSIS — D219 Benign neoplasm of connective and other soft tissue, unspecified: Secondary | ICD-10-CM

## 2019-02-27 DIAGNOSIS — N7011 Chronic salpingitis: Secondary | ICD-10-CM | POA: Diagnosis not present

## 2019-02-27 DIAGNOSIS — N80129 Deep endometriosis of ovary, unspecified ovary: Secondary | ICD-10-CM

## 2019-02-27 DIAGNOSIS — D4959 Neoplasm of unspecified behavior of other genitourinary organ: Secondary | ICD-10-CM | POA: Diagnosis not present

## 2019-02-27 DIAGNOSIS — N801 Endometriosis of ovary: Secondary | ICD-10-CM | POA: Diagnosis not present

## 2019-02-27 MED ORDER — NORETHINDRONE ACETATE 5 MG PO TABS: 5.0000 mg | ORAL_TABLET | Freq: Every day | ORAL | 6 refills | Status: DC

## 2019-02-27 NOTE — Progress Notes (Signed)
Follow up appointment for results  Chief Complaint  Patient presents with  . Discuss Surgery    Blood pressure (!) 136/95, pulse 84, height 5\' 3"  (1.6 m), weight 222 lb (100.7 kg), last menstrual period 02/14/2019.    GYNECOLOGIC SONOGRAM   Lindsay Shaw is a 39 y.o. [id number] Patient's last menstrual period was 02/14/2019. She is here for a follow up pelvic sonogram for fibroids,bilat ovarian cysts,pelvic pain.  Uterus                      10.3 x 7 x 4.9 cm, Total uterine volume 189 cc, heterogeneous anteverted/retroverted uterus,fundal right pedunculated fibroid (#1) 2.8 x 2.9 x 2.9 cm,(#2) posterior fundal fibroid 2.9 x 2.6 x 3.2 cm  Endometrium          5.1 mm, symmetrical, wnl  Right ovary             2.7 x 2.2 x 3.3 cm, simple tubular cystic structure right adnexa (tube vs ovarian cyst) 6.5 x 1.9 x 3.7 cm  Left ovary                10.5 x 6.5 x 6.7 cm, two left ovarian cysts (#1)  4 x 4.3 x 2.1 cm complex cyst with low level internal echoes (? Endometrioma),(#2) complex left ovarian cyst (limited view) 6.2 x 4.9 x 6.2 cm  No free fluid   Technician Comments:  PELVIC US TA/TV: heterogeneous anteverted/retroverted uterus,fundal right pedunculated fibroid (#1) 2.8 x 2.9 x 2.9 cm,(#2) posterior fundal fibroid 2.9 x 2.6 x 3.2 cm, EEC 5.1 mm, two left ovarian cysts (#1)  4 x 4.3 x 2.1 cm complex cyst with low level internal echoes (? Endometrioma),(#2) complex left ovarian cyst (limited view) 6.2 x 4.9 x 6.2 cm,simple tubular cystic structure right adnexa (tube vs ovarian cyst) 6.5 x 1.9 x 3.7 cm,no free fluid,ovaries appear mobile,left adnexal pain during ultrasound   Chaperone [address] [name] 02/19/2019 1:18 PM  Clinical Impression and recommendations:  I have reviewed the sonogram results above.Ultrasound compared to scan of October 5 , 2020, after supression x 3 months with   Combined with the patient's current clinical course, below are my impressions and  any appropriate recommendations for management based on the sonographic findings:   FINDINGS:                      Uterus: slight uterine enlargement with fundal intramural fibroids and pedunculated small fibroid. The endometrial thickness is supressed by Megace, has thinned in comparison to prior study, appears normal thickness. Leff Adnexa: persistend complex cyst with low level internal echoes, 6.2 cm maximum diameter, suggestive of endometrioma with homogenous ground glass echoes. The previously noted left corpus luteum cyst is resolved. No free fluid. Right adnexa:Persistent simple tubular extending down inferiorly behind uterus, unchanged from October ultrasound.           /   IMPRESSION:   1. Retroverted fibroid uterus                               2. 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  Repeat Ca 125 for comparison, and agree with planned surgical consultation with Dr Elonda Husky.  [name] 02/23/2019  MEDS ordered this encounter: Meds ordered this encounter  Medications  . norethindrone (AYGESTIN) 5 MG tablet    Sig: Take 1 tablet (5 mg total) by mouth daily.    Dispense:  30 tablet    Refill:  6    Orders for this encounter: Orders Placed This Encounter  Procedures  . CA 125    Impression:   ICD-10-CM   1. Endometrioma of ovary  N80.1   2. Fibroid  D21.9   3. 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 Ovarian neoplasm  D49.59 CA 125     Plan: Pt is doing well on the norethindrone for now and she wants to continue to manage that way for the time being She does have episodic sharp left sided pain Will track her CA 125 and if there is any significant increase that ould also sway our decision regarding TAH BSO Pt pain will also be a determinant  Follow Up: Return in about 6 months (around  08/27/2019) for Follow up, with Dr Elonda Husky.       Face to face time:  15 minutes  Greater than 50% of the visit time was spent in counseling and coordination of care with the patient.  The summary and outline of the counseling and care coordination is summarized in the note above.   All questions were answered.  Past Medical History:  Diagnosis Date  . Hypertension     Past Surgical History:  Procedure Laterality Date  . ANKLE SURGERY    . APPENDECTOMY    . CESAREAN SECTION    . CESAREAN SECTION WITH BILATERAL TUBAL LIGATION    . OVARIAN CYST DRAINAGE      OB History    Gravida  3   Para  3   Term  3   Preterm      AB      Living  3     SAB      TAB      Ectopic      Multiple      Live Births              No Known Allergies  Social History   Socioeconomic History  . Marital status: Single    Spouse name: Not on file  . Number of children: Not on file  . Years of education: Not on file  . Highest education level: Not on file  Occupational History  . Not on file  Tobacco Use  . Smoking status: Current Every Day Smoker    Packs/day: 1.00    Types: Cigarettes  . Smokeless tobacco: Never Used  Substance and Sexual Activity  . Alcohol use: Yes    Comment: Occ  . Drug use: No  . Sexual activity: Yes    Birth control/protection: Surgical    Comment: tubal ligation  Other Topics Concern  . Not on file  Social History Narrative  . Not on file   Social Determinants of Health   Financial Resource Strain:   . Difficulty of Paying Living Expenses: Not on file  Food Insecurity:   . Worried About Charity fundraiser in the Last Year: Not on file  . Ran Out of Food in the Last Year: Not on file  Transportation Needs:   . Lack of Transportation (Medical): Not on file  . Lack of Transportation (Non-Medical): Not  on file  Physical Activity:   . Days of Exercise per Week: Not on file  . Minutes of Exercise per Session: Not on file  Stress:   .  Feeling of Stress : Not on file  Social Connections:   . Frequency of Communication with Friends and Family: Not on file  . Frequency of Social Gatherings with Friends and Family: Not on file  . Attends Religious Services: Not on file  . Active Member of Clubs or Organizations: Not on file  . Attends Archivist Meetings: Not on file  . Marital Status: Not on file    Family History  Problem Relation Age of Onset  . Hypertension Maternal Grandmother   . Diabetes Maternal Grandmother   . Hypertension Mother   . Hypertension Brother   . Hypertension Sister

## 2019-02-28 LAB — CA 125: Cancer Antigen (CA) 125: 10.9 U/mL (ref 0.0–38.1)

## 2019-05-06 ENCOUNTER — Other Ambulatory Visit (HOSPITAL_COMMUNITY): Payer: Self-pay | Admitting: Internal Medicine

## 2019-05-06 DIAGNOSIS — Z1231 Encounter for screening mammogram for malignant neoplasm of breast: Secondary | ICD-10-CM

## 2019-05-15 ENCOUNTER — Ambulatory Visit (HOSPITAL_COMMUNITY): Payer: PRIVATE HEALTH INSURANCE

## 2019-06-16 ENCOUNTER — Other Ambulatory Visit: Payer: Self-pay | Admitting: Adult Health

## 2019-09-11 ENCOUNTER — Ambulatory Visit: Payer: PRIVATE HEALTH INSURANCE | Admitting: Adult Health

## 2019-09-15 ENCOUNTER — Other Ambulatory Visit: Payer: Self-pay | Admitting: Obstetrics & Gynecology

## 2019-09-19 ENCOUNTER — Ambulatory Visit: Payer: PRIVATE HEALTH INSURANCE | Admitting: Adult Health

## 2019-09-22 ENCOUNTER — Other Ambulatory Visit: Payer: Self-pay | Admitting: Adult Health

## 2019-09-22 MED ORDER — NORETHINDRONE ACETATE 5 MG PO TABS: 5.0000 mg | ORAL_TABLET | Freq: Every day | ORAL | 3 refills | Status: DC

## 2019-09-22 NOTE — Progress Notes (Signed)
Refilled norethindrone

## 2019-10-02 ENCOUNTER — Ambulatory Visit: Payer: PRIVATE HEALTH INSURANCE | Admitting: Obstetrics & Gynecology

## 2019-10-06 ENCOUNTER — Other Ambulatory Visit: Payer: Self-pay

## 2019-10-06 ENCOUNTER — Encounter (HOSPITAL_COMMUNITY): Payer: Self-pay | Admitting: Emergency Medicine

## 2019-10-06 DIAGNOSIS — R1010 Upper abdominal pain, unspecified: Secondary | ICD-10-CM | POA: Diagnosis not present

## 2019-10-06 DIAGNOSIS — D72829 Elevated white blood cell count, unspecified: Secondary | ICD-10-CM | POA: Diagnosis not present

## 2019-10-06 DIAGNOSIS — I10 Essential (primary) hypertension: Secondary | ICD-10-CM | POA: Diagnosis not present

## 2019-10-06 DIAGNOSIS — F17210 Nicotine dependence, cigarettes, uncomplicated: Secondary | ICD-10-CM | POA: Insufficient documentation

## 2019-10-06 DIAGNOSIS — Z79899 Other long term (current) drug therapy: Secondary | ICD-10-CM | POA: Insufficient documentation

## 2019-10-06 LAB — URINALYSIS, ROUTINE W REFLEX MICROSCOPIC
Bacteria, UA: NONE SEEN
Bilirubin Urine: NEGATIVE
Glucose, UA: NEGATIVE mg/dL
Ketones, ur: NEGATIVE mg/dL
Leukocytes,Ua: NEGATIVE
Nitrite: NEGATIVE
Protein, ur: NEGATIVE mg/dL
Specific Gravity, Urine: 1.012 (ref 1.005–1.030)
pH: 6 (ref 5.0–8.0)

## 2019-10-06 LAB — CBC
HCT: 42 % (ref 36.0–46.0)
Hemoglobin: 13.4 g/dL (ref 12.0–15.0)
MCH: 29.9 pg (ref 26.0–34.0)
MCHC: 31.9 g/dL (ref 30.0–36.0)
MCV: 93.8 fL (ref 80.0–100.0)
Platelets: 316 10*3/uL (ref 150–400)
RBC: 4.48 MIL/uL (ref 3.87–5.11)
RDW: 15 % (ref 11.5–15.5)
WBC: 12.8 10*3/uL — ABNORMAL HIGH (ref 4.0–10.5)
nRBC: 0 % (ref 0.0–0.2)

## 2019-10-06 LAB — BASIC METABOLIC PANEL
Anion gap: 11 (ref 5–15)
BUN: 12 mg/dL (ref 6–20)
CO2: 23 mmol/L (ref 22–32)
Calcium: 9.2 mg/dL (ref 8.9–10.3)
Chloride: 103 mmol/L (ref 98–111)
Creatinine, Ser: 0.88 mg/dL (ref 0.44–1.00)
GFR calc Af Amer: >60 mL/min (ref >60)
GFR calc non Af Amer: >60 mL/min (ref >60)
Glucose, Bld: 94 mg/dL (ref 70–99)
Potassium: 4 mmol/L (ref 3.5–5.1)
Sodium: 137 mmol/L (ref 135–145)

## 2019-10-06 LAB — LIPASE, BLOOD: Lipase: 27 U/L (ref 11–51)

## 2019-10-06 LAB — PREGNANCY, URINE: Preg Test, Ur: NEGATIVE

## 2019-10-06 NOTE — ED Triage Notes (Signed)
Patient states abdominal pain in the center of her stomach that is now progressing downward into her lower abdominal area. Patient states she was sent home from work today due to the pain and discomfort. Patient does appear to be distended in the abdominal area. Patient unable to sit down due to the pain and pressure. Patient denies any vomiting.

## 2019-10-07 ENCOUNTER — Emergency Department (HOSPITAL_COMMUNITY): Payer: 59

## 2019-10-07 ENCOUNTER — Emergency Department (HOSPITAL_COMMUNITY)
Admission: EM | Admit: 2019-10-07 | Discharge: 2019-10-07 | Disposition: A | Discharge status: Home / Self Care | Payer: 59 | Attending: Emergency Medicine | Admitting: Emergency Medicine

## 2019-10-07 DIAGNOSIS — R1010 Upper abdominal pain, unspecified: Secondary | ICD-10-CM

## 2019-10-07 MED ORDER — OMEPRAZOLE 20 MG PO CPDR: DELAYED_RELEASE_CAPSULE | ORAL | 0 refills | Status: DC

## 2019-10-07 MED ORDER — FENTANYL CITRATE (PF) 100 MCG/2ML IJ SOLN
50.0000 ug | Freq: Once | INTRAMUSCULAR | Status: AC
  Administered 2019-10-07: 50 ug via INTRAVENOUS
  Filled 2019-10-07: qty 2

## 2019-10-07 MED ORDER — SODIUM CHLORIDE 0.9 % IV BOLUS
1000.0000 mL | Freq: Once | INTRAVENOUS | Status: AC
  Administered 2019-10-07: 1000 mL via INTRAVENOUS

## 2019-10-07 MED ORDER — IOHEXOL 300 MG/ML  SOLN
100.0000 mL | Freq: Once | INTRAMUSCULAR | Status: AC | PRN
  Administered 2019-10-07: 100 mL via INTRAVENOUS

## 2019-10-07 NOTE — Discharge Instructions (Addendum)
Start the omeprazole, you can also buy it over-the-counter twice a day for the next 2 weeks then once a day.  You can call [phone] to get scheduled for an outpatient ultrasound of your gallbladder.  If you continue to have pain, call Dr. Roseanne Kaufman office, the gastroenterologist on-call to be evaluated.  You still have the ovarian cyst that [facility], please follow-up with them about them.

## 2019-10-07 NOTE — ED Provider Notes (Signed)
[facility] EMERGENCY DEPARTMENT Provider Note   CSN: [id number] Arrival date & time: 10/06/19  2043   Time seen 2:09 AM  History Chief Complaint  Patient presents with  . Abdominal Pain    Lindsay Shaw is a 39 y.o. female.  HPI   Patient reports she started having upper abdominal pain on Saturday, September 11.  The pain has been there constantly.  It does wax and wane however.  She states eating makes it hurt more.  She has not been able to eat or drink today.  Nothing she does makes it feel better.  She denies nausea, vomiting, diarrhea or being aware of fever or chills.  She states she has never had this pain before.  She states she has had abdominal surgeries including a C-section twice, appendicitis surgery, and drains for ovarian abscesses.  She is unaware of any family history of gallstone problems.  [facility]   Past Medical History:  Diagnosis Date  . Hypertension     Patient Active Problem List   Diagnosis Date Noted  . DUB (dysfunctional uterine bleeding) 11/29/2018  . Bilateral ovarian cysts 10/31/2018  . Fibroid 10/31/2018  . Endometrioma of ovary 10/31/2018  . Hypertension 10/21/2018  . Chronic RLQ pain 10/21/2018  . Encounter for gynecological examination with Papanicolaou smear of cervix 10/21/2018  . Screening for colorectal cancer 10/21/2018    Past Surgical History:  Procedure Laterality Date  . ANKLE SURGERY    . APPENDECTOMY    . CESAREAN SECTION    . CESAREAN SECTION WITH BILATERAL TUBAL LIGATION    . OVARIAN CYST DRAINAGE       OB History    Gravida  3   Para  3   Term  3   Preterm      AB      Living  3     SAB      TAB      Ectopic      Multiple      Live Births              Family History  Problem Relation Age of Onset  . Hypertension Maternal Grandmother   . Diabetes Maternal Grandmother   . Hypertension Mother   . Hypertension Brother   . Hypertension Sister     Social  History   Tobacco Use  . Smoking status: Current Every Day Smoker    Packs/day: 1.00    Types: Cigarettes  . Smokeless tobacco: Never Used  Vaping Use  . Vaping Use: Never used  Substance Use Topics  . Alcohol use: Yes    Comment: Occ  . Drug use: No  Employed as a Quarry manager.  Home Medications Prior to Admission medications   Medication Sig Start Date End Date Taking? Authorizing Provider  amLODipine (NORVASC) 5 MG tablet TAKE 1 TABLET BY MOUTH ONCE DAILY. 06/16/19   Estill Dooms, NP  norethindrone (AYGESTIN) 5 MG tablet Take 1 tablet (5 mg total) by mouth daily. 09/22/19   Estill Dooms, NP  omeprazole (PRILOSEC) 20 MG capsule Take 1 po BID x 2 weeks then once a day 10/07/19   Rolland Porter, MD    Allergies    Patient has no known allergies.  Review of Systems   Review of Systems  All other systems reviewed and are negative.   Physical Exam Updated Vital Signs BP (!) 171/126 (BP Location: Right Arm)   Pulse (!) 113   Temp 100  F (37.8 C) (Oral)   Resp 20   Ht 5\' 3"  (1.6 m)   Wt 105.3 kg   LMP 09/29/2019   SpO2 100%   BMI 41.11 kg/m   Physical Exam Vitals and nursing note reviewed.  Constitutional:      General: She is not in acute distress.    Appearance: Normal appearance. She is obese.  HENT:     Head: Normocephalic and atraumatic.     Right Ear: External ear normal.     Left Ear: External ear normal.     Nose: Nose normal.     Mouth/Throat:     Mouth: Mucous membranes are dry.  Eyes:     Extraocular Movements: Extraocular movements intact.     Conjunctiva/sclera: Conjunctivae normal.     Pupils: Pupils are equal, round, and reactive to light.  Cardiovascular:     Rate and Rhythm: Normal rate and regular rhythm.     Pulses: Normal pulses.     Heart sounds: Normal heart sounds.  Pulmonary:     Effort: Pulmonary effort is normal. No respiratory distress.     Breath sounds: Normal breath sounds.  Abdominal:     General: Bowel sounds are decreased.      Palpations: Abdomen is soft.     Tenderness: There is abdominal tenderness. There is no guarding or rebound.       Comments: Patient has diffuse tenderness of her upper abdomen without guarding or rebound.  Musculoskeletal:        General: Normal range of motion.     Cervical back: Normal range of motion.  Skin:    General: Skin is warm and dry.  Neurological:     General: No focal deficit present.     Mental Status: She is alert and oriented to person, place, and time.     Cranial Nerves: No cranial nerve deficit.  Psychiatric:        Mood and Affect: Affect is flat.        Speech: Speech normal.        Behavior: Behavior normal.     ED Results / Procedures / Treatments   Labs (all labs ordered are listed, but only abnormal results are displayed) Results for orders placed or performed during the hospital encounter of 10/07/19  Urinalysis, Routine w reflex microscopic Urine, Clean Catch  Result Value Ref Range   Color, Urine YELLOW YELLOW   APPearance CLEAR CLEAR   Specific Gravity, Urine 1.012 1.005 - 1.030   pH 6.0 5.0 - 8.0   Glucose, UA NEGATIVE NEGATIVE mg/dL   Hgb urine dipstick LARGE (A) NEGATIVE   Bilirubin Urine NEGATIVE NEGATIVE   Ketones, ur NEGATIVE NEGATIVE mg/dL   Protein, ur NEGATIVE NEGATIVE mg/dL   Nitrite NEGATIVE NEGATIVE   Leukocytes,Ua NEGATIVE NEGATIVE   RBC / HPF 0-5 0 - 5 RBC/hpf   WBC, UA 0-5 0 - 5 WBC/hpf   Bacteria, UA NONE SEEN NONE SEEN   Squamous Epithelial / LPF 0-5 0 - 5   Mucus PRESENT   Pregnancy, urine  Result Value Ref Range   Preg Test, Ur NEGATIVE NEGATIVE  CBC  Result Value Ref Range   WBC 12.8 (H) 4.0 - 10.5 K/uL   RBC 4.48 3.87 - 5.11 MIL/uL   Hemoglobin 13.4 12.0 - 15.0 g/dL   HCT 42.0 36 - 46 %   MCV 93.8 80.0 - 100.0 fL   MCH 29.9 26.0 - 34.0 pg   MCHC 31.9 30.0 -  36.0 g/dL   RDW 15.0 11.5 - 15.5 %   Platelets 316 150 - 400 K/uL   nRBC 0.0 0.0 - 0.2 %  Basic metabolic panel  Result Value Ref Range   Sodium  137 135 - 145 mmol/L   Potassium 4.0 3.5 - 5.1 mmol/L   Chloride 103 98 - 111 mmol/L   CO2 23 22 - 32 mmol/L   Glucose, Bld 94 70 - 99 mg/dL   BUN 12 6 - 20 mg/dL   Creatinine, Ser 0.88 0.44 - 1.00 mg/dL   Calcium 9.2 8.9 - 10.3 mg/dL   GFR calc non Af Amer >60 >60 mL/min   GFR calc Af Amer >60 >60 mL/min   Anion gap 11 5 - 15  Lipase, blood  Result Value Ref Range   Lipase 27 11 - 51 U/L   Laboratory interpretation all normal except leukocytosis    EKG None  Radiology CT Abdomen Pelvis W Contrast  Result Date: 10/07/2019 CLINICAL DATA:  Abdominal pain and fever EXAM: CT ABDOMEN AND PELVIS WITH CONTRAST TECHNIQUE: Multidetector CT imaging of the abdomen and pelvis was performed using the standard protocol following bolus administration of intravenous contrast. CONTRAST:  139mL OMNIPAQUE IOHEXOL 300 MG/ML  SOLN COMPARISON:  08/30/2011 FINDINGS: Lower chest:  No contributory findings. Hepatobiliary: No focal liver abnormality.No evidence of biliary obstruction or stone. Pancreas: Unremarkable. Spleen: Unremarkable. Adrenals/Urinary Tract: Negative adrenals. No hydronephrosis or stone. Unremarkable bladder. Stomach/Bowel: Fat inflammation in the pelvis which is contiguous with sigmoid colon and lower abdominal small bowel loops but none of the structures appear primarily thickened. Appendectomy. There are a few proximal colonic diverticula. Vascular/Lymphatic: No acute vascular abnormality. No mass or adenopathy. Reproductive:Cystic density in the left ovary which measures 5.2 cm maximal. There was a similarly sized mass on February 19, 2019 pelvic ultrasound. A smaller somewhat curvilinear cystic density is present superior to the left ovary, suspicious for hydrosalpinx. Other: Trace pelvic fluid.  No pneumoperitoneum. Musculoskeletal: No acute abnormalities. IMPRESSION: Inflammatory process in the low abdomen and pelvis arising from uncertain organ. There is inflammation around non thickened  small bowel loops, normal sigmoid colon, and adjacent to the left ovary. The left ovary is notable for a 5.2 cm cystic density mass which is likely chronic based on January 2021 pelvic ultrasound, but there is an adjacent low-density collection which could be hydrosalpinx. Please correlate for PID symptoms and consider pelvic ultrasound. Electronically Signed   By: Monte Fantasia M.D.   On: 10/07/2019 04:10    Procedures Procedures (including critical care time)  Medications Ordered in ED Medications  sodium chloride 0.9 % bolus 1,000 mL (1,000 mLs Intravenous New Bag/Given 10/07/19 0323)  fentaNYL (SUBLIMAZE) injection 50 mcg (50 mcg Intravenous Given 10/07/19 0321)  iohexol (OMNIPAQUE) 300 MG/ML solution 100 mL (100 mLs Intravenous Contrast Given 10/07/19 0334)    ED Course  I have reviewed the triage vital signs and the nursing notes.  Pertinent labs & imaging results that were available during my care of the patient were reviewed by me and considered in my medical decision making (see chart for details).    MDM Rules/Calculators/A&P                          Patient has leukocytosis and upper abdominal pain.  Duration was given for cholecystitis however her pain is not localized to the right upper quadrant, she does express the pain is around the upper abdomen consistent with the  upper: So concern for colitis.  CT scan of the abdomen and pelvis was done.  She was given IV fluids and IV pain medication.  She has not been able to eat or drink because it makes the pain worse and she does appear to be dehydrated.  Reexam at 5:00 AM after reviewing her CT scan.  Patient has absolutely no pain in her lower abdomen at all.  Looking at her chart she has had several pelvic ultrasound showing the ovarian cysts are [facility] followed [facility].  Her pain is improved after the pain medication.  We discussed going home and putting on a PPI and coming back for an outpatient gallbladder ultrasound just to  make sure she does not have something going on there and she is agreeable.  She was referred to gastroenterology as an outpatient for further evaluation if needed.  Final Clinical Impression(s) / ED Diagnoses Final diagnoses:  Upper abdominal pain    Rx / DC Orders ED Discharge Orders         Ordered    omeprazole (PRILOSEC) 20 MG capsule        10/07/19 0506    US Abdomen Limited RUQ/Gall Gladder        10/07/19 0507         Plan discharge  Rolland Porter, MD, Barbette Or, MD 10/07/19 [phone]

## 2019-10-23 ENCOUNTER — Ambulatory Visit (INDEPENDENT_AMBULATORY_CARE_PROVIDER_SITE_OTHER): Payer: PRIVATE HEALTH INSURANCE | Admitting: Obstetrics & Gynecology

## 2019-10-23 ENCOUNTER — Encounter: Payer: Self-pay | Admitting: Obstetrics & Gynecology

## 2019-10-23 ENCOUNTER — Other Ambulatory Visit: Payer: Self-pay

## 2019-10-23 ENCOUNTER — Other Ambulatory Visit (HOSPITAL_COMMUNITY)
Admission: RE | Admit: 2019-10-23 | Discharge: 2019-10-23 | Disposition: A | Discharge status: Home / Self Care | Payer: 59 | Source: Ambulatory Visit | Attending: Obstetrics & Gynecology | Admitting: Obstetrics & Gynecology

## 2019-10-23 VITALS — BP 146/102 | HR 62 | Ht 63.0 in | Wt 227.0 lb

## 2019-10-23 DIAGNOSIS — Z113 Encounter for screening for infections with a predominantly sexual mode of transmission: Secondary | ICD-10-CM | POA: Insufficient documentation

## 2019-10-23 NOTE — Progress Notes (Signed)
       Chief Complaint  Patient presents with  . Follow-up    vaginal discharge    Blood pressure (!) 146/102, pulse 62, height 5\' 3"  (1.6 m), weight 227 lb (103 kg), last menstrual period 09/29/2019.  39 y.o. G3P3003 Patient's last menstrual period was 09/29/2019. The current method of family planning is oral progesterone-only contraceptive.  Subjective Vaginal discharge for 3days Itching no Irritation yes Odor no Similar to previous no  Previous treatment n/a  Objective Vulva:  normal appearing vulva with no masses, tenderness or lesions Vagina:  normal mucosa, thin grey discharge Cervix:  no cervical motion tenderness and no lesions Uterus:  normal size, contour, position, consistency, mobility, non-tender Adnexa: ovaries:present,  normal adnexa in size, nontender and no masses     Pertinent ROS No burning with urination, frequency or urgency No nausea, vomiting or diarrhea Nor fever chills or other constitutional symptoms   Labs or studies Wet Prep:   A sample of vaginal discharge was obtained from the posterior fornix using a cotton swab. 2 drops of saline were placed on a slide and the cotton swab was immersed in the saline. Microscopic evaluation was performed and results were as follows:  Negative  for yeast  Negative for clue cells , consistent with Bacterial vaginosis Negative for trichomonas  Normal WBC population   Whiff test: Negative     Impression Diagnoses this Encounter::   ICD-10-CM   1. Screen for STD (sexually transmitted disease)  Z11.3 Cervicovaginal ancillary only( [place])    Established relevant diagnosis(es):   Plan/Recommendations: No orders of the defined types were placed in this encounter.   Labs or Scans Ordered: No orders of the defined types were placed in this encounter.   Management:: No management needed. Change back to her non fragrant soap  Follow up Return if symptoms worsen or fail to  improve.       All questions were answered.

## 2019-10-27 LAB — CERVICOVAGINAL ANCILLARY ONLY
Chlamydia: NEGATIVE
Comment: NEGATIVE
Comment: NEGATIVE
Comment: NORMAL
Neisseria Gonorrhea: NEGATIVE
Trichomonas: NEGATIVE

## 2020-01-19 ENCOUNTER — Telehealth: Payer: Self-pay

## 2020-01-19 NOTE — Telephone Encounter (Signed)
Pt needing refill on norethindrone, pt stated she just picked up her last refill on 01/19/20

## 2020-01-20 MED ORDER — NORETHINDRONE ACETATE 5 MG PO TABS: 5.0000 mg | ORAL_TABLET | Freq: Every day | ORAL | 3 refills | Status: DC

## 2020-01-20 NOTE — Telephone Encounter (Signed)
Refilled aygestin

## 2020-05-03 ENCOUNTER — Telehealth: Payer: Self-pay

## 2020-05-03 NOTE — Telephone Encounter (Signed)
Pt stated she has be taking her medication regularly but she hasn't had a normal flow since she started the medication she will spot but not have a normal cycle. She is wanting to know if there  Is another medication that she can be prescribed or how she needs to precede. She also stated that she feels drained of energy.

## 2020-05-04 NOTE — Telephone Encounter (Signed)
Called pt needs to make appt

## 2020-05-06 ENCOUNTER — Ambulatory Visit (INDEPENDENT_AMBULATORY_CARE_PROVIDER_SITE_OTHER): Payer: Self-pay | Admitting: Obstetrics & Gynecology

## 2020-05-06 ENCOUNTER — Other Ambulatory Visit: Payer: Self-pay

## 2020-05-06 ENCOUNTER — Encounter: Payer: Self-pay | Admitting: Obstetrics & Gynecology

## 2020-05-06 VITALS — BP 174/104 | HR 84 | Wt 230.0 lb

## 2020-05-06 DIAGNOSIS — D500 Iron deficiency anemia secondary to blood loss (chronic): Secondary | ICD-10-CM

## 2020-05-06 DIAGNOSIS — N939 Abnormal uterine and vaginal bleeding, unspecified: Secondary | ICD-10-CM

## 2020-05-06 LAB — POCT HEMOGLOBIN: Hemoglobin: 10.9 g/dL — AB (ref 11–14.6)

## 2020-05-06 MED ORDER — MEDROXYPROGESTERONE ACETATE 10 MG PO TABS: 10.0000 mg | ORAL_TABLET | Freq: Every day | ORAL | 6 refills | Status: DC

## 2020-05-06 NOTE — Progress Notes (Signed)
   GYN VISIT Patient name: Lindsay Shaw MRN [id number]  Date of birth: Mar 06, 1980 Chief Complaint:   Vaginal Bleeding (Heavy for bleeding for 3 weeks)  History of Present Illness:   Lindsay Shaw is a 40 y.o. G45P3003  female being seen today for AUB.  Typically menses 4 days of spotting and then resolve.  However, recently menses started on 04/12/20 and has continued since that time.  Wear 2 pads at a time during the day to help avoid accidents.  Often, will have to change her pad 10x per day.  She will note gushes during work with heavy lifting.  Also, when uses bathroom reports it is "pouring" out.  Typically no clots, but yesterday noted some dime-sized clots.  Prior to March was not having issues with POPs.  Denies pelvic pain.     Records reviewed- previously was being treated with Megace then Norethindrone.   Last [place] 01/2019: 10.3cm uterus with 2 fibroids-   right pedunculated fibroid (#1) 2.8 x 2.9 x 2.9 cm,(#2) posterior fundal fibroid 2.9 x 2.6 x 3.2 cm,  - two left ovarian cysts (#1) 4 x 4.3 x 2.1 cm complex cyst with low level internal echoes- suspected endometrioma (#2) complex left ovarian cyst (limited view) 6.2 x 4.9 x 6.2 cm -simple tubular cystic structure right adnexa (tube vs ovarian cyst) 6.5 x 1.9 x 3.7 cm,no free fluid,ovaries appear mobile  Depression screen [facility] 2/9 10/21/2018  Decreased Interest 0  Down, Depressed, Hopeless 0  PHQ - 2 Score 0     Review of Systems:   Pertinent items are noted in HPI Denies fever/chills, dizziness, headaches, visual disturbances, fatigue, shortness of breath, chest pain, abdominal pain, vomiting, bowel movements, urination, or intercourse unless otherwise stated above.  Pertinent History Reviewed:  Reviewed past medical,surgical, social, obstetrical and family history.  Reviewed problem list, medications and allergies. Physical Assessment:  There were no vitals taken for this visit.       Physical Examination:   General  appearance: alert, well appearing, and in no distress  Psych: mood appropriate, normal affect  Skin: warm & dry   Cardiovascular: normal heart rate noted  Respiratory: normal respiratory effort, no distress  Abdomen: obese, soft, non-tender   Pelvic: VULVA: normal appearing vulva with no masses, tenderness or lesions, VAGINA: normal appearing vagina with normal color and discharge, no lesions, CERVIX: normal appearing cervix without discharge or lesions, ~ 10cc of dark blood noted in vault- no active bleeding from cervix appreciated, UTERUS: uterus is normal size, shape, consistency and nontender- exam limited due to body habitus  Extremities: no edema   Chaperone: Peggy Dones    Assessment & Plan:  1) AUB -discussed all options including Depo, change to other progesterone-only pill, IUD or surgical intervention -after much review of risk/benefit of each option, desires to proceed with IUD; however, current insurance out of date- pt to call and sort out this out ASAP -she would prefer to avoid surgical intervention- though strongly encouraged her to reconsider -for now plan to transition to Provera as she did not want to stay with current medication  2) Iron def. Anemia -Hgb as above -iron daily -encouraged hydration  Orders Placed This Encounter  Procedures  . POCT hemoglobin    Return in about 4 weeks (around 06/03/2020) for AUB follow up (possible IUD).   Janyth Pupa, DO Attending [place], [facility] for [employer], [place]

## 2020-05-31 ENCOUNTER — Encounter (HOSPITAL_COMMUNITY): Payer: Self-pay | Admitting: Emergency Medicine

## 2020-05-31 ENCOUNTER — Emergency Department (HOSPITAL_COMMUNITY)
Admission: EM | Admit: 2020-05-31 | Discharge: 2020-05-31 | Disposition: A | Discharge status: Home / Self Care | Payer: Self-pay | Attending: Emergency Medicine | Admitting: Emergency Medicine

## 2020-05-31 ENCOUNTER — Other Ambulatory Visit: Payer: Self-pay

## 2020-05-31 ENCOUNTER — Emergency Department (HOSPITAL_COMMUNITY): Payer: Self-pay

## 2020-05-31 DIAGNOSIS — I10 Essential (primary) hypertension: Secondary | ICD-10-CM | POA: Insufficient documentation

## 2020-05-31 DIAGNOSIS — R102 Pelvic and perineal pain: Secondary | ICD-10-CM | POA: Insufficient documentation

## 2020-05-31 DIAGNOSIS — Z8742 Personal history of other diseases of the female genital tract: Secondary | ICD-10-CM | POA: Insufficient documentation

## 2020-05-31 DIAGNOSIS — R1032 Left lower quadrant pain: Secondary | ICD-10-CM | POA: Insufficient documentation

## 2020-05-31 DIAGNOSIS — Z79899 Other long term (current) drug therapy: Secondary | ICD-10-CM | POA: Insufficient documentation

## 2020-05-31 LAB — CBC WITH DIFFERENTIAL/PLATELET
Abs Immature Granulocytes: 0.03 10*3/uL (ref 0.00–0.07)
Basophils Absolute: 0 10*3/uL (ref 0.0–0.1)
Basophils Relative: 0 %
Eosinophils Absolute: 0.1 10*3/uL (ref 0.0–0.5)
Eosinophils Relative: 1 %
HCT: 44.5 % (ref 36.0–46.0)
Hemoglobin: 14.1 g/dL (ref 12.0–15.0)
Immature Granulocytes: 0 %
Lymphocytes Relative: 28 %
Lymphs Abs: 2.7 10*3/uL (ref 0.7–4.0)
MCH: 29.9 pg (ref 26.0–34.0)
MCHC: 31.7 g/dL (ref 30.0–36.0)
MCV: 94.3 fL (ref 80.0–100.0)
Monocytes Absolute: 0.6 10*3/uL (ref 0.1–1.0)
Monocytes Relative: 6 %
Neutro Abs: 6.1 10*3/uL (ref 1.7–7.7)
Neutrophils Relative %: 65 %
Platelets: 295 10*3/uL (ref 150–400)
RBC: 4.72 MIL/uL (ref 3.87–5.11)
RDW: 15.4 % (ref 11.5–15.5)
WBC: 9.6 10*3/uL (ref 4.0–10.5)
nRBC: 0 % (ref 0.0–0.2)

## 2020-05-31 LAB — URINALYSIS, ROUTINE W REFLEX MICROSCOPIC
Bacteria, UA: NONE SEEN
Bilirubin Urine: NEGATIVE
Glucose, UA: NEGATIVE mg/dL
Ketones, ur: NEGATIVE mg/dL
Leukocytes,Ua: NEGATIVE
Nitrite: NEGATIVE
Protein, ur: NEGATIVE mg/dL
RBC / HPF: >50 RBC/hpf — ABNORMAL HIGH (ref 0–5)
Specific Gravity, Urine: 1.018 (ref 1.005–1.030)
pH: 7 (ref 5.0–8.0)

## 2020-05-31 LAB — COMPREHENSIVE METABOLIC PANEL
ALT: 17 U/L (ref 0–44)
AST: 19 U/L (ref 15–41)
Albumin: 3.6 g/dL (ref 3.5–5.0)
Alkaline Phosphatase: 54 U/L (ref 38–126)
Anion gap: 6 (ref 5–15)
BUN: 12 mg/dL (ref 6–20)
CO2: 26 mmol/L (ref 22–32)
Calcium: 9 mg/dL (ref 8.9–10.3)
Chloride: 107 mmol/L (ref 98–111)
Creatinine, Ser: 0.8 mg/dL (ref 0.44–1.00)
GFR, Estimated: >60 mL/min (ref >60)
Glucose, Bld: 96 mg/dL (ref 70–99)
Potassium: 4.8 mmol/L (ref 3.5–5.1)
Sodium: 139 mmol/L (ref 135–145)
Total Bilirubin: 0.5 mg/dL (ref 0.3–1.2)
Total Protein: 7.4 g/dL (ref 6.5–8.1)

## 2020-05-31 LAB — LIPASE, BLOOD: Lipase: 24 U/L (ref 11–51)

## 2020-05-31 LAB — PREGNANCY, URINE: Preg Test, Ur: NEGATIVE

## 2020-05-31 MED ORDER — TRAMADOL HCL 50 MG PO TABS: 50.0000 mg | ORAL_TABLET | Freq: Four times a day (QID) | ORAL | 0 refills | Status: AC | PRN

## 2020-05-31 NOTE — Discharge Instructions (Addendum)
As discussed, your ultrasound findings today show that you have thickening of the endometrium of your uterus.  The medication you are taking should help with this, but you will need further evaluation by your gynecologist.  Call the office to arrange a follow-up appointment.

## 2020-05-31 NOTE — ED Triage Notes (Signed)
Pt reports she has bilateral cysts to her ovaries "for awhile"' reports increased pain to bilateral lower abdomen/pelvis and back since yesterday

## 2020-05-31 NOTE — ED Provider Notes (Signed)
Emergency Medicine Provider Triage Evaluation Note  Lindsay Shaw , a 40 y.o. female  was evaluated in triage.  Pt complains of left lower quadrant pain since yesterday.  She has a history of bilateral ovarian cysts and is [facility].  She was recently put on methylprogesterone 10 mg, but states her symptoms are not improving.  Currently on her menses.  Pain was gradual yesterday but worse today.  She describes pain as severe and cramp-like.  Denies fever, vomiting, flank pain or dysuria.  Review of Systems  Positive: Left lower abdominal pain, vaginal bleeding. Negative: Fever, vomiting, flank pain and dysuria  Physical Exam  BP (!) 153/89 (BP Location: Right Arm)   Pulse 77   Temp 98.6 F (37 C) (Oral)   Resp 16   Ht 5\' 3"  (1.6 m)   Wt 87.5 kg   LMP 05/27/2020   SpO2 99%   BMI 34.19 kg/m  Gen:   Awake, no distress   Resp:  Normal effort  MSK:   Moves extremities without difficulty  Other:  Tender to palpation left lower abdomen.  Medical Decision Making  Medically screening exam initiated at 11:59 AM.  Appropriate orders placed.  Lindsay Shaw was informed that the remainder of the evaluation will be completed by another provider, this initial triage assessment does not replace that evaluation, and the importance of remaining in the ED until their evaluation is complete.  Patient with known history of bilateral ovarian cysts here with increased left lower abdominal and pelvic pain.  No vomiting or fever.  Patient will need further evaluation in the ER and likely labs and ultrasound to rule out torsion.  Patient agreeable to plan.   Lindsay Parkinson, PA-C 05/31/20 1202    Lindsay Saver, MD 05/31/20 [phone]

## 2020-05-31 NOTE — ED Provider Notes (Signed)
[facility] EMERGENCY DEPARTMENT Provider Note   CSN: [id number] Arrival date & time: 05/31/20  1029     History Chief Complaint  Patient presents with  . Abdominal Pain    Lindsay Shaw is a 40 y.o. female.  HPI      Lindsay Shaw is a 40 y.o. female who presents to the Emergency Department complaining of left sided pelvic pain.  She has a history of dysfunctional uterine bleeding and bilateral ovarian cysts.  She has been seen by gynecology and started on Provera for vaginal bleeding 2 to 3 weeks ago.  She states that she woke yesterday with worsening pain to her left pelvic region.  Describes the pain as crampy.  She denies nausea, vomiting or diarrhea.  Denies chest pain or shortness of breath, fever or chills.    Past Medical History:  Diagnosis Date  . Hypertension     Patient Active Problem List   Diagnosis Date Noted  . DUB (dysfunctional uterine bleeding) 11/29/2018  . Bilateral ovarian cysts 10/31/2018  . Fibroid 10/31/2018  . Endometrioma of ovary 10/31/2018  . Hypertension 10/21/2018  . Chronic RLQ pain 10/21/2018  . Encounter for gynecological examination with Papanicolaou smear of cervix 10/21/2018  . Screening for colorectal cancer 10/21/2018    Past Surgical History:  Procedure Laterality Date  . ANKLE SURGERY    . APPENDECTOMY    . CESAREAN SECTION    . CESAREAN SECTION WITH BILATERAL TUBAL LIGATION    . OVARIAN CYST DRAINAGE       OB History    Gravida  3   Para  3   Term  3   Preterm      AB      Living  3     SAB      IAB      Ectopic      Multiple      Live Births              Family History  Problem Relation Age of Onset  . Hypertension Maternal Grandmother   . Diabetes Maternal Grandmother   . Hypertension Mother   . Hypertension Brother   . Hypertension Sister     Social History   Tobacco Use  . Smoking status: Current Every Day Smoker    Packs/day: 1.00    Types: Cigarettes  . Smokeless tobacco:  Never Used  Vaping Use  . Vaping Use: Never used  Substance Use Topics  . Alcohol use: Yes    Comment: Occ  . Drug use: No    Home Medications Prior to Admission medications   Medication Sig Start Date End Date Taking? Authorizing Provider  amLODipine (NORVASC) 5 MG tablet TAKE 1 TABLET BY MOUTH ONCE DAILY. Patient taking differently: Take 5 mg by mouth daily. 06/16/19  Yes Derrek Monaco A, NP  ferrous sulfate 325 (65 FE) MG tablet Take 325 mg by mouth daily with breakfast.   Yes [provider]  medroxyPROGESTERone (PROVERA) 10 MG tablet Take 1 tablet (10 mg total) by mouth daily. 05/06/20 06/05/20 Yes Janyth Pupa, DO    Allergies    Patient has no known allergies.  Review of Systems   Review of Systems  Constitutional: Negative for chills, fatigue and fever.  Respiratory: Negative for shortness of breath.   Cardiovascular: Negative for chest pain.  Gastrointestinal: Negative for abdominal pain, nausea and vomiting.  Genitourinary: Positive for pelvic pain. Negative for dysuria, flank pain and hematuria.  Musculoskeletal: Negative for  arthralgias, back pain, myalgias, neck pain and neck stiffness.  Skin: Negative for rash.  Neurological: Negative for dizziness, weakness and numbness.  Hematological: Does not bruise/bleed easily.    Physical Exam Updated Vital Signs BP (!) 163/105   Pulse 69   Temp 98.6 F (37 C) (Oral)   Resp 16   Ht 5\' 3"  (1.6 m)   Wt 87.5 kg   LMP 05/27/2020   SpO2 100%   BMI 34.19 kg/m   Physical Exam Vitals and nursing note reviewed.  Constitutional:      General: She is not in acute distress.    Appearance: Normal appearance. She is well-developed. She is not ill-appearing.  HENT:     Head: Normocephalic.  Eyes:     Pupils: Pupils are equal, round, and reactive to light.  Neck:     Thyroid: No thyromegaly.     Meningeal: Kernig's sign absent.  Cardiovascular:     Rate and Rhythm: Normal rate and regular rhythm.      Pulses: Normal pulses.  Pulmonary:     Effort: Pulmonary effort is normal.     Breath sounds: Normal breath sounds. No wheezing.  Abdominal:     Palpations: Abdomen is soft.     Tenderness: There is no abdominal tenderness. There is no guarding or rebound.     Comments: Mild ttp of the left lower pelvic region.  No guarding or rebound tenderness.  No CVA tenderness.  Musculoskeletal:        General: Normal range of motion.  Skin:    General: Skin is warm.     Capillary Refill: Capillary refill takes less than 2 seconds.     Findings: No erythema or rash.  Neurological:     Mental Status: She is alert and oriented to person, place, and time.     Sensory: No sensory deficit.     Motor: No weakness.     ED Results / Procedures / Treatments   Labs (all labs ordered are listed, but only abnormal results are displayed) Labs Reviewed  URINALYSIS, ROUTINE W REFLEX MICROSCOPIC - Abnormal; Notable for the following components:      Result Value   Hgb urine dipstick LARGE (*)    RBC / HPF >50 (*)    All other components within normal limits  COMPREHENSIVE METABOLIC PANEL  LIPASE, BLOOD  CBC WITH DIFFERENTIAL/PLATELET  PREGNANCY, URINE    EKG None  Radiology US PELVIC DOPPLER (TORSION R/O OR MASS ARTERIAL FLOW)  Result Date: 05/31/2020 CLINICAL DATA:  Pelvic pain, primarily left-sided EXAM: TRANSABDOMINAL AND TRANSVAGINAL ULTRASOUND OF PELVIS DOPPLER ULTRASOUND OF OVARIES TECHNIQUE: Study was performed transabdominally to optimize pelvic field of view evaluation and transvaginally to optimize internal visceral architecture evaluation. Color and duplex Doppler ultrasound was utilized to evaluate blood flow to the ovaries. COMPARISON:  CT abdomen and pelvis October 07, 2019 FINDINGS: Uterus Measurements: 10.6 x 5.1 x 6.1 cm = volume: 171.9 mL. Hypoechoic mass, presumably representing a fibroid, in the rightward aspect of the superior uterine fundus measuring 2.7 x 1.7 x 2.5 cm.  Endometrium Thickness: 27 mm, thickened. Contour of the endometrium appears smooth. Right ovary Measurements: 3.3 x 2.6 x 2.8 cm = volume: 12.7 mL. Apparent follicles within the right ovary noted, larger measuring 2.4 x 1.7 x 2.4 cm. Beyond apparent follicles, no extrauterine pelvic mass on the right. Left ovary Measurements: 6.4 x 5.1 x 6.2 cm = volume: 104.7 mL. Decreased echogenicity solid mass on the left, largely hypoechoic, measuring  4.6 x 4.8 x 5.1 cm. Pulsed Doppler evaluation of both ovaries demonstrates normal low-resistance arterial and venous waveforms. Other findings small amount of free pelvic fluid. IMPRESSION: 1. Thickened endometrium which warrants further evaluation. Endometrial thickness is considered abnormal. Consider follow-up by [place] in 6-8 weeks, during the week immediately following menses (exam timing is critical). Tissue sampling may be warranted given this degree of thickening; gynecologic assessment advised in this regard. 2. Suspect endometrioma arising from the left ovary measuring 4.6 x 4.8 x 5.1 cm. Nonemergent pelvic MR potentially could be helpful for further assessment of this mass. 3. Apparent leiomyoma arising from the superior uterine fundus measuring 2.7 x 1.7 x 2.5 cm. 4. Low resistance waveform in each ovary. No findings indicative of ovarian torsion on either side currently. Electronically Signed   By: Lowella Grip III M.D.   On: 05/31/2020 13:29   US PELVIC COMPLETE WITH TRANSVAGINAL  Result Date: 05/31/2020 CLINICAL DATA:  Pelvic pain, primarily left-sided EXAM: TRANSABDOMINAL AND TRANSVAGINAL ULTRASOUND OF PELVIS DOPPLER ULTRASOUND OF OVARIES TECHNIQUE: Study was performed transabdominally to optimize pelvic field of view evaluation and transvaginally to optimize internal visceral architecture evaluation. Color and duplex Doppler ultrasound was utilized to evaluate blood flow to the ovaries. COMPARISON:  CT abdomen and pelvis October 07, 2019 FINDINGS: Uterus  Measurements: 10.6 x 5.1 x 6.1 cm = volume: 171.9 mL. Hypoechoic mass, presumably representing a fibroid, in the rightward aspect of the superior uterine fundus measuring 2.7 x 1.7 x 2.5 cm. Endometrium Thickness: 27 mm, thickened. Contour of the endometrium appears smooth. Right ovary Measurements: 3.3 x 2.6 x 2.8 cm = volume: 12.7 mL. Apparent follicles within the right ovary noted, larger measuring 2.4 x 1.7 x 2.4 cm. Beyond apparent follicles, no extrauterine pelvic mass on the right. Left ovary Measurements: 6.4 x 5.1 x 6.2 cm = volume: 104.7 mL. Decreased echogenicity solid mass on the left, largely hypoechoic, measuring 4.6 x 4.8 x 5.1 cm. Pulsed Doppler evaluation of both ovaries demonstrates normal low-resistance arterial and venous waveforms. Other findings small amount of free pelvic fluid. IMPRESSION: 1. Thickened endometrium which warrants further evaluation. Endometrial thickness is considered abnormal. Consider follow-up by [place] in 6-8 weeks, during the week immediately following menses (exam timing is critical). Tissue sampling may be warranted given this degree of thickening; gynecologic assessment advised in this regard. 2. Suspect endometrioma arising from the left ovary measuring 4.6 x 4.8 x 5.1 cm. Nonemergent pelvic MR potentially could be helpful for further assessment of this mass. 3. Apparent leiomyoma arising from the superior uterine fundus measuring 2.7 x 1.7 x 2.5 cm. 4. Low resistance waveform in each ovary. No findings indicative of ovarian torsion on either side currently. Electronically Signed   By: Lowella Grip III M.D.   On: 05/31/2020 13:29    Procedures Procedures   Medications Ordered in ED Medications - No data to display  ED Course  I have reviewed the triage vital signs and the nursing notes.  Pertinent labs & imaging results that were available during my care of the patient were reviewed by me and considered in my medical decision making (see chart for  details).    MDM Rules/Calculators/A&P                          Pt here with known hx of ovarian cysts, worsening left pelvic pain since yesterday.  She was started on Provera 2-3 weeks ago for DUB.    On  exam, she has some mild ttp of the left lower pelvic region, no guarding or rebound.  Labs unremarkable and urinalysis is without evidence of infection.  Patient is not pregnant. Hemodynamically stable.  Labs w/o significant findings. We will proceed with pelvic ultrasound with Doppler study to rule out ovarian torsion.  Pelvic ultrasound shows thickened endometrium, doppler shows nml waveforms.  Pt currently taking Provera, she is agreeable to close GYN f/u.  The patient appears reasonably screened and/or stabilized for discharge and I doubt any other medical condition or other [facility] requiring further screening, evaluation, or treatment in the ED at this time prior to discharge.   Final Clinical Impression(s) / ED Diagnoses Final diagnoses:  Left lower quadrant pain  Left lower quadrant pain    Rx / DC Orders ED Discharge Orders    None       Kem Parkinson, PA-C 06/02/20 2310    Lajean Saver, MD 06/03/20 [phone]

## 2020-05-31 NOTE — ED Notes (Signed)
Not in WR when called for room 

## 2020-06-14 ENCOUNTER — Ambulatory Visit: Payer: PRIVATE HEALTH INSURANCE | Admitting: Obstetrics & Gynecology

## 2020-06-14 NOTE — Progress Notes (Deleted)
   GYN VISIT Patient name: Lindsay Shaw MRN [id number]  Date of birth: [birth year] Chief Complaint:   No chief complaint on file.  History of Present Illness:   Lindsay Shaw is a 40 y.o. [id number]  female being seen today for follow up regarding:  AUB: Today she notes *** Previously reported that starting 04/12/20 she had continuous bleeding. Wear 2 pads at a time during the day to help avoid accidents.  Often, will have to change her pad 10x per day.  Prior to March was not having issues with POPs.  At her last visit, she was started on daily progesterone.     Records reviewed- previously was being treated with Megace then Norethindrone.    Last [place] 01/2019: 10.3cm uterus with 2 fibroids-   right pedunculated fibroid (#1) 2.8 x 2.9 x 2.9 cm,(#2) posterior fundal fibroid 2.9 x 2.6 x 3.2 cm,  - two left ovarian cysts (#1) 4 x 4.3 x 2.1 cm complex cyst with low level internal echoes- suspected endometrioma (#2) complex left ovarian cyst (limited view) 6.2 x 4.9 x 6.2 cm -simple tubular cystic structure right adnexa (tube vs ovarian cyst) 6.5 x 1.9 x 3.7 cm,no free fluid,ovaries appear mobile.     Patient's last menstrual period was 05/27/2020.  Depression screen [facility] 2/9 10/21/2018  Decreased Interest 0  Down, Depressed, Hopeless 0  PHQ - 2 Score 0     Review of Systems:   Pertinent items are noted in HPI Denies fever/chills, dizziness, headaches, visual disturbances, fatigue, shortness of breath, chest pain, abdominal pain, vomiting, *** problems with periods, bowel movements, urination, or intercourse unless otherwise stated above.  Pertinent History Reviewed:  Reviewed past medical,surgical, social, obstetrical and family history.  Reviewed problem list, medications and allergies. Physical Assessment:  There were no vitals filed for this visit.There is no height or weight on file to calculate BMI.       Physical Examination:   General appearance: alert, well appearing, and in no  distress  Psych: mood appropriate, normal affect  Skin: warm & dry   Cardiovascular: normal heart rate noted  Respiratory: normal respiratory effort, no distress  Abdomen: soft, non-tender   Pelvic: {pelvic exam:315900::"normal external genitalia, vulva, vagina, cervix, uterus and adnexa"}  Extremities: no edema   Chaperone: {Chaperone:19197::"N/A","Latisha Cresenzo","Janet Young","Amanda Andrews","Peggy Dones","Nicole Jones","Angel Neas"}    Assessment & Plan:  1) AUB ***   No orders of the defined types were placed in this encounter.   No follow-ups on file.   Janyth Pupa, DO Attending [place], [facility] for [employer], [place]

## 2020-08-19 ENCOUNTER — Other Ambulatory Visit: Payer: Medicaid Other | Admitting: Obstetrics & Gynecology

## 2020-10-05 ENCOUNTER — Other Ambulatory Visit: Payer: Medicaid Other | Admitting: Obstetrics & Gynecology

## 2020-10-08 ENCOUNTER — Other Ambulatory Visit: Payer: Medicaid Other | Admitting: Obstetrics & Gynecology

## 2020-11-24 ENCOUNTER — Telehealth: Payer: Self-pay | Admitting: Adult Health

## 2020-11-24 ENCOUNTER — Other Ambulatory Visit: Payer: Self-pay

## 2020-11-24 DIAGNOSIS — N939 Abnormal uterine and vaginal bleeding, unspecified: Secondary | ICD-10-CM

## 2020-11-24 MED ORDER — MEDROXYPROGESTERONE ACETATE 10 MG PO TABS: 10.0000 mg | ORAL_TABLET | Freq: Every day | ORAL | 6 refills | Status: DC

## 2020-11-24 NOTE — Telephone Encounter (Signed)
Patient called stating that she would lie Anderson [place] to call her ina refill of her Medroxyprogesterone to her [place]. Please contact patient when medication has been called in.

## 2021-02-06 ENCOUNTER — Other Ambulatory Visit: Payer: Self-pay

## 2021-02-06 ENCOUNTER — Emergency Department (HOSPITAL_COMMUNITY): Payer: Medicaid Other

## 2021-02-06 ENCOUNTER — Encounter (HOSPITAL_COMMUNITY): Payer: Self-pay

## 2021-02-06 ENCOUNTER — Emergency Department (HOSPITAL_COMMUNITY)
Admission: EM | Admit: 2021-02-06 | Discharge: 2021-02-06 | Disposition: A | Discharge status: Home / Self Care | Payer: Medicaid Other | Attending: Emergency Medicine | Admitting: Emergency Medicine

## 2021-02-06 DIAGNOSIS — H539 Unspecified visual disturbance: Secondary | ICD-10-CM

## 2021-02-06 DIAGNOSIS — Z79899 Other long term (current) drug therapy: Secondary | ICD-10-CM | POA: Insufficient documentation

## 2021-02-06 DIAGNOSIS — R519 Headache, unspecified: Secondary | ICD-10-CM

## 2021-02-06 MED ORDER — KETOROLAC TROMETHAMINE 30 MG/ML IJ SOLN
30.0000 mg | Freq: Once | INTRAMUSCULAR | Status: AC
  Administered 2021-02-06: 30 mg via INTRAVENOUS
  Filled 2021-02-06: qty 1

## 2021-02-06 MED ORDER — SODIUM CHLORIDE 0.9 % IV BOLUS
1000.0000 mL | Freq: Once | INTRAVENOUS | Status: AC
  Administered 2021-02-06: 1000 mL via INTRAVENOUS

## 2021-02-06 MED ORDER — DIPHENHYDRAMINE HCL 50 MG/ML IJ SOLN
12.5000 mg | Freq: Once | INTRAMUSCULAR | Status: AC
  Administered 2021-02-06: 12.5 mg via INTRAVENOUS
  Filled 2021-02-06: qty 1

## 2021-02-06 MED ORDER — METOCLOPRAMIDE HCL 5 MG/ML IJ SOLN
10.0000 mg | Freq: Once | INTRAMUSCULAR | Status: AC
  Administered 2021-02-06: 10 mg via INTRAVENOUS
  Filled 2021-02-06: qty 2

## 2021-02-06 NOTE — ED Triage Notes (Signed)
Pt to ED by POV from home with c/o headache which began at approx 1500 today. Pt endorses seeing "floaters" from this headache. Rates pain 10/10. Negative stroke scale. VSS, NADN.

## 2021-02-06 NOTE — Discharge Instructions (Signed)
You were seen in the emergency department for a headache and vision changes.  You had a CAT scan that did not show any significant findings.  Your symptoms improved with some medication.  Please rest and drink plenty of fluids.  Return to the emergency department if any worsening or concerning symptoms.

## 2021-02-06 NOTE — ED Provider Notes (Signed)
[facility] EMERGENCY DEPARTMENT Provider Note   CSN: [id number] Arrival date & time: 02/06/21  2007     History  Chief Complaint  Patient presents with   Headache    Lindsay Shaw is a 41 y.o. female.  She is here with a complaint of a headache that started around 3 PM today.  She said it was gradual in onset and slowly intensified.  It was associated with seeing spots in her vision.  She tried to eat some food and rest and see if that would help but it did not.  No nausea or vomiting.  No chest pain shortness of breath.  No photophobia.  She has a history of migraines but she said this is different than her migraine, less intense and not associated with photophobia.  The history is provided by the patient.  Headache Pain location:  Frontal Quality:  Dull Severity currently:  10/10 Severity at highest:  10/10 Onset quality:  Gradual Duration:  6 hours Timing:  Constant Progression:  Worsening Chronicity:  New Similar to prior headaches: no   Relieved by:  Nothing Worsened by:  Nothing Ineffective treatments:  Resting in a darkened room Associated symptoms: visual change   Associated symptoms: no abdominal pain, no blurred vision, no cough, no eye pain, no fever, no focal weakness, no loss of balance, no neck pain, no neck stiffness, no sore throat and no vomiting       Home Medications Prior to Admission medications   Medication Sig Start Date End Date Taking? Authorizing Provider  amLODipine (NORVASC) 5 MG tablet TAKE 1 TABLET BY MOUTH ONCE DAILY. Patient taking differently: Take 5 mg by mouth daily. 06/16/19   Estill Dooms, NP  ferrous sulfate 325 (65 FE) MG tablet Take 325 mg by mouth daily with breakfast.    [provider]  medroxyPROGESTERone (PROVERA) 10 MG tablet Take 1 tablet (10 mg total) by mouth daily. 11/24/20 12/24/20  Estill Dooms, NP  traMADol (ULTRAM) 50 MG tablet Take 1 tablet (50 mg total) by mouth every 6 (six) hours as needed.  05/31/20   Triplett, Tammy, PA-C      Allergies    Patient has no known allergies.    Review of Systems   Review of Systems  Constitutional:  Negative for fever.  HENT:  Negative for sore throat.   Eyes:  Positive for visual disturbance. Negative for blurred vision and pain.  Respiratory:  Negative for cough and shortness of breath.   Cardiovascular:  Negative for chest pain.  Gastrointestinal:  Negative for abdominal pain and vomiting.  Genitourinary:  Negative for dysuria.  Musculoskeletal:  Negative for neck pain and neck stiffness.  Skin:  Negative for rash.  Neurological:  Positive for headaches. Negative for focal weakness and loss of balance.   Physical Exam Updated Vital Signs BP 138/86 (BP Location: Right Arm)    Pulse 97    Temp 97.7 F (36.5 C) (Oral)    Resp 18    Ht 5\' 3"  (1.6 m)    Wt 87.5 kg    SpO2 99%    BMI 34.17 kg/m  Physical Exam Vitals and nursing note reviewed.  Constitutional:      General: She is not in acute distress.    Appearance: Normal appearance. She is well-developed.  HENT:     Head: Normocephalic and atraumatic.  Eyes:     Conjunctiva/sclera: Conjunctivae normal.  Cardiovascular:     Rate and Rhythm: Normal rate and regular  rhythm.     Heart sounds: No murmur heard. Pulmonary:     Effort: Pulmonary effort is normal. No respiratory distress.     Breath sounds: Normal breath sounds. No stridor. No wheezing.  Abdominal:     Palpations: Abdomen is soft.     Tenderness: There is no abdominal tenderness. There is no guarding or rebound.  Musculoskeletal:        General: No tenderness or deformity. Normal range of motion.     Cervical back: Neck supple. No rigidity.  Skin:    General: Skin is warm and dry.  Neurological:     General: No focal deficit present.     Mental Status: She is alert.     GCS: GCS eye subscore is 4. GCS verbal subscore is 5. GCS motor subscore is 6.     Cranial Nerves: No cranial nerve deficit, dysarthria or facial  asymmetry.     Sensory: No sensory deficit.     Motor: No weakness.    ED Results / Procedures / Treatments   Labs (all labs ordered are listed, but only abnormal results are displayed) Labs Reviewed - No data to display  EKG None  Radiology CT Head Wo Contrast  Result Date: 02/06/2021 CLINICAL DATA:  Headache EXAM: CT HEAD WITHOUT CONTRAST TECHNIQUE: Contiguous axial images were obtained from the base of the skull through the vertex without intravenous contrast. RADIATION DOSE REDUCTION: This exam was performed according to the departmental dose-optimization program which includes automated exposure control, adjustment of the mA and/or kV according to patient size and/or use of iterative reconstruction technique. COMPARISON:  None. FINDINGS: Brain: There is no mass, hemorrhage or extra-axial collection. The size and configuration of the ventricles and extra-axial CSF spaces are normal. The brain parenchyma is normal, without acute or chronic infarction. Vascular: No abnormal hyperdensity of the major intracranial arteries or dural venous sinuses. No intracranial atherosclerosis. Skull: The visualized skull base, calvarium and extracranial soft tissues are normal. Sinuses/Orbits: No fluid levels or advanced mucosal thickening of the visualized paranasal sinuses. No mastoid or middle ear effusion. The orbits are normal. IMPRESSION: Normal head CT. Electronically Signed   By: Ulyses Jarred M.D.   On: 02/06/2021 21:22    Procedures Procedures    Medications Ordered in ED Medications  metoCLOPramide (REGLAN) injection 10 mg (10 mg Intravenous Given 02/06/21 2159)  ketorolac (TORADOL) 30 MG/ML injection 30 mg (30 mg Intravenous Given 02/06/21 2155)  sodium chloride 0.9 % bolus 1,000 mL (0 mLs Intravenous Stopped 02/06/21 2242)  diphenhydrAMINE (BENADRYL) injection 12.5 mg (12.5 mg Intravenous Given 02/06/21 2156)    ED Course/ Medical Decision Making/ A&P Clinical Course as of 02/07/21 7096   Nancy Fetter Feb 06, 2021  2231 After medication patient states her headache and visual symptoms are improved.  She has a ride.  She is comfortable plan for discharge. [MB]    Clinical Course User Index [MB] Hayden Rasmussen, MD                           Medical Decision Making  This patient complains of frontal headache and visual disturbance; this involves an extensive number of treatment Options and is a complaint that carries with it a high risk of complications and Morbidity. The differential includes headache, migraine dehydration, glaucoma, stroke, bleed  I ordered medication IV fluids Toradol Reglan with improvement in her symptoms I ordered imaging studies which included head CT and I independently  visualized and interpreted imaging which showed no acute bleed or stroke Previous records obtained and reviewed in epic no recent admissions  After the interventions stated above, I reevaluated the patient and found patient is symptomatically improved.  She is comfortable plan for discharge.  Return instructions discussed         Final Clinical Impression(s) / ED Diagnoses Final diagnoses:  Bad headache  Visual disturbance    Rx / DC Orders ED Discharge Orders     None         Hayden Rasmussen, MD 02/07/21 [phone]

## 2021-05-02 ENCOUNTER — Other Ambulatory Visit: Payer: Self-pay

## 2021-05-02 DIAGNOSIS — N939 Abnormal uterine and vaginal bleeding, unspecified: Secondary | ICD-10-CM

## 2021-05-03 MED ORDER — MEDROXYPROGESTERONE ACETATE 10 MG PO TABS: 10.0000 mg | ORAL_TABLET | Freq: Every day | ORAL | 6 refills | Status: DC

## 2021-12-07 ENCOUNTER — Other Ambulatory Visit: Payer: Self-pay | Admitting: Adult Health

## 2021-12-07 DIAGNOSIS — N939 Abnormal uterine and vaginal bleeding, unspecified: Secondary | ICD-10-CM

## 2022-07-18 ENCOUNTER — Other Ambulatory Visit: Payer: Self-pay | Admitting: Adult Health

## 2022-07-18 DIAGNOSIS — N939 Abnormal uterine and vaginal bleeding, unspecified: Secondary | ICD-10-CM

## 2022-09-26 ENCOUNTER — Telehealth: Payer: Self-pay | Admitting: Adult Health

## 2022-09-26 DIAGNOSIS — N939 Abnormal uterine and vaginal bleeding, unspecified: Secondary | ICD-10-CM

## 2022-09-26 MED ORDER — MEDROXYPROGESTERONE ACETATE 10 MG PO TABS: 10.0000 mg | ORAL_TABLET | Freq: Every day | ORAL | 1 refills | Status: DC

## 2022-09-26 NOTE — Telephone Encounter (Signed)
Patient called to let us know that she has 1 more refill of medroxyprogesteron per pharmacy. Please advise.

## 2022-09-26 NOTE — Telephone Encounter (Signed)
Refilled provera, needs appt

## 2022-09-26 NOTE — Addendum Note (Signed)
Addended by: Cyril Mourning A on: 09/26/2022 01:48 PM   Modules accepted: Orders

## 2022-12-14 ENCOUNTER — Other Ambulatory Visit: Payer: Self-pay | Admitting: Adult Health

## 2022-12-14 DIAGNOSIS — N939 Abnormal uterine and vaginal bleeding, unspecified: Secondary | ICD-10-CM

## 2023-02-12 ENCOUNTER — Other Ambulatory Visit: Payer: Self-pay | Admitting: Adult Health

## 2023-02-12 DIAGNOSIS — N939 Abnormal uterine and vaginal bleeding, unspecified: Secondary | ICD-10-CM

## 2023-04-25 ENCOUNTER — Other Ambulatory Visit: Payer: Self-pay | Admitting: Adult Health

## 2023-04-25 DIAGNOSIS — N939 Abnormal uterine and vaginal bleeding, unspecified: Secondary | ICD-10-CM

## 2023-06-27 ENCOUNTER — Other Ambulatory Visit: Payer: Self-pay | Admitting: Adult Health

## 2023-06-27 DIAGNOSIS — N939 Abnormal uterine and vaginal bleeding, unspecified: Secondary | ICD-10-CM

## 2023-07-11 ENCOUNTER — Other Ambulatory Visit: Payer: Self-pay | Admitting: Obstetrics & Gynecology

## 2023-07-11 ENCOUNTER — Telehealth: Payer: Self-pay | Admitting: Adult Health

## 2023-07-11 ENCOUNTER — Other Ambulatory Visit: Payer: Self-pay | Admitting: Adult Health

## 2023-07-11 DIAGNOSIS — N939 Abnormal uterine and vaginal bleeding, unspecified: Secondary | ICD-10-CM

## 2023-07-11 MED ORDER — MEDROXYPROGESTERONE ACETATE 5 MG PO TABS: 10.0000 mg | ORAL_TABLET | Freq: Every day | ORAL | 3 refills | Status: DC

## 2023-07-11 NOTE — Telephone Encounter (Signed)
 She is out of the Provera .  Would you send in a one month supply until her appt on 7/14?

## 2023-07-11 NOTE — Telephone Encounter (Signed)
 Pt calling in regards to her provera  she has appt schedule on 08/06/23 asking for one script sent to [facility] please call patient as she doesn't use my chart

## 2023-07-11 NOTE — Progress Notes (Signed)
 Refill for provera  sent in   Wadie Liew, DO Attending Obstetrician & Gynecologist, [facility] for [facility], [facility]

## 2023-08-06 ENCOUNTER — Encounter: Payer: Self-pay | Admitting: Obstetrics & Gynecology

## 2023-08-06 ENCOUNTER — Ambulatory Visit: Payer: Self-pay | Admitting: Obstetrics & Gynecology

## 2023-08-06 ENCOUNTER — Other Ambulatory Visit (HOSPITAL_COMMUNITY)
Admission: RE | Admit: 2023-08-06 | Discharge: 2023-08-06 | Disposition: A | Discharge status: Home / Self Care | Source: Ambulatory Visit | Attending: Obstetrics & Gynecology | Admitting: Obstetrics & Gynecology

## 2023-08-06 VITALS — BP 168/119 | HR 87 | Ht 63.0 in | Wt 252.0 lb

## 2023-08-06 DIAGNOSIS — I10 Essential (primary) hypertension: Secondary | ICD-10-CM

## 2023-08-06 DIAGNOSIS — Z01419 Encounter for gynecological examination (general) (routine) without abnormal findings: Secondary | ICD-10-CM

## 2023-08-06 DIAGNOSIS — Z1231 Encounter for screening mammogram for malignant neoplasm of breast: Secondary | ICD-10-CM

## 2023-08-06 DIAGNOSIS — N946 Dysmenorrhea, unspecified: Secondary | ICD-10-CM | POA: Diagnosis not present

## 2023-08-06 DIAGNOSIS — N920 Excessive and frequent menstruation with regular cycle: Secondary | ICD-10-CM | POA: Diagnosis not present

## 2023-08-06 MED ORDER — LOSARTAN POTASSIUM-HCTZ 50-12.5 MG PO TABS: 1.0000 | ORAL_TABLET | Freq: Every day | ORAL | 2 refills | Status: AC

## 2023-08-06 MED ORDER — MEDROXYPROGESTERONE ACETATE 10 MG PO TABS: 10.0000 mg | ORAL_TABLET | Freq: Every day | ORAL | 11 refills | Status: AC

## 2023-08-06 NOTE — Progress Notes (Signed)
 Subjective:     Lindsay Shaw is a 43 y.o. female here for a routine exam.  No LMP recorded. [id number] Birth Control Method:  BTL Menstrual Calendar(currently): regular on provera  10 daily  Current complaints: none.   Current acute medical issues:  HTN untreated   Recent Gynecologic History No LMP recorded. Last Pap: unsure,   Last mammogram: never, schedules,    Past Medical History:  Diagnosis Date   Hypertension     Past Surgical History:  Procedure Laterality Date   ANKLE SURGERY     APPENDECTOMY     CESAREAN SECTION     CESAREAN SECTION WITH BILATERAL TUBAL LIGATION     OVARIAN CYST DRAINAGE      OB History     Gravida  3   Para  3   Term  3   Preterm      AB      Living  3      SAB      IAB      Ectopic      Multiple      Live Births              Social History   Socioeconomic History   Marital status: Single    Spouse name: Not on file   Number of children: Not on file   Years of education: Not on file   Highest education level: Not on file  Occupational History   Not on file  Tobacco Use   Smoking status: Every Day    Current packs/day: 1.00    Types: Cigarettes   Smokeless tobacco: Never  Vaping Use   Vaping status: Never Used  Substance and Sexual Activity   Alcohol use: Yes    Comment: Occ   Drug use: No   Sexual activity: Yes    Birth control/protection: Surgical    Comment: tubal ligation  Other Topics Concern   Not on file  Social History Narrative   Not on file   Social Drivers of Health   Financial Resource Strain: Not on file  Food Insecurity: Not on file  Transportation Needs: Not on file  Physical Activity: Not on file  Stress: Not on file  Social Connections: Not on file    Family History  Problem Relation Age of Onset   Hypertension Maternal Grandmother    Diabetes Maternal Grandmother    Hypertension Mother    Hypertension Brother    Hypertension Sister      Current Outpatient Medications:     losartan -hydrochlorothiazide (HYZAAR) 50-12.5 MG tablet, Take 1 tablet by mouth daily., Disp: 30 tablet, Rfl: 2   medroxyPROGESTERone  (PROVERA ) 10 MG tablet, Take 1 tablet (10 mg total) by mouth daily., Disp: 30 tablet, Rfl: 11   traMADol  (ULTRAM ) 50 MG tablet, Take 1 tablet (50 mg total) by mouth every 6 (six) hours as needed., Disp: 10 tablet, Rfl: 0   amLODipine  (NORVASC ) 5 MG tablet, TAKE 1 TABLET BY MOUTH ONCE DAILY. (Patient not taking: Reported on 08/06/2023), Disp: 30 tablet, Rfl: 0   ferrous sulfate 325 (65 FE) MG tablet, Take 325 mg by mouth daily with breakfast. (Patient not taking: Reported on 08/06/2023), Disp: , Rfl:   Review of Systems  Review of Systems  Constitutional: Negative for fever, chills, weight loss, malaise/fatigue and diaphoresis.  HENT: Negative for hearing loss, ear pain, nosebleeds, congestion, sore throat, neck pain, tinnitus and ear discharge.   Eyes: Negative for blurred vision, double vision, photophobia, pain, discharge  and redness.  Respiratory: Negative for cough, hemoptysis, sputum production, shortness of breath, wheezing and stridor.   Cardiovascular: Negative for chest pain, palpitations, orthopnea, claudication, leg swelling and PND.  Gastrointestinal: negative for abdominal pain. Negative for heartburn, nausea, vomiting, diarrhea, constipation, blood in stool and melena.  Genitourinary: Negative for dysuria, urgency, frequency, hematuria and flank pain.  Musculoskeletal: Negative for myalgias, back pain, joint pain and falls.  Skin: Negative for itching and rash.  Neurological: Negative for dizziness, tingling, tremors, sensory change, speech change, focal weakness, seizures, loss of consciousness, weakness and headaches.  Endo/Heme/Allergies: Negative for environmental allergies and polydipsia. Does not bruise/bleed easily.  Psychiatric/Behavioral: Negative for depression, suicidal ideas, hallucinations, memory loss and substance abuse. The patient  is not nervous/anxious and does not have insomnia.        Objective:  Blood pressure (!) 168/119, pulse 87, height 5' 3 (1.6 m), weight 252 lb (114.3 kg).   Physical Exam  Vitals reviewed. Constitutional: She is oriented to person, place, and time. She appears well-developed and well-nourished.  HENT:  Head: Normocephalic and atraumatic.        Right Ear: External ear normal.  Left Ear: External ear normal.  Nose: Nose normal.  Mouth/Throat: Oropharynx is clear and moist.  Eyes: Conjunctivae and EOM are normal. Pupils are equal, round, and reactive to light. Right eye exhibits no discharge. Left eye exhibits no discharge. No scleral icterus.  Neck: Normal range of motion. Neck supple. No tracheal deviation present. No thyromegaly present.  Cardiovascular: Normal rate, regular rhythm, normal heart sounds and intact distal pulses.  Exam reveals no gallop and no friction rub.   No murmur heard. Respiratory: Effort normal and breath sounds normal. No respiratory distress. She has no wheezes. She has no rales. She exhibits no tenderness.  GI: Soft. Bowel sounds are normal. She exhibits no distension and no mass. There is no tenderness. There is no rebound and no guarding.  Genitourinary:  Breasts no masses skin changes or nipple changes bilaterally      Vulva is normal without lesions Vagina is pink moist without discharge Cervix normal in appearance and pap is done Uterus is normal size shape and contour Adnexa is negative with normal sized ovaries   Musculoskeletal: Normal range of motion. She exhibits no edema and no tenderness.  Neurological: She is alert and oriented to person, place, and time. She has normal reflexes. She displays normal reflexes. No cranial nerve deficit. She exhibits normal muscle tone. Coordination normal.  Skin: Skin is warm and dry. No rash noted. No erythema. No pallor.  Psychiatric: She has a normal mood and affect. Her behavior is normal. Judgment and  thought content normal.       Medications Ordered at today's visit: Meds ordered this encounter  Medications   losartan -hydrochlorothiazide (HYZAAR) 50-12.5 MG tablet    Sig: Take 1 tablet by mouth daily.    Dispense:  30 tablet    Refill:  2   medroxyPROGESTERone  (PROVERA ) 10 MG tablet    Sig: Take 1 tablet (10 mg total) by mouth daily.    Dispense:  30 tablet    Refill:  11    Other orders placed at today's visit: No orders of the defined types were placed in this encounter.    ASSESSMENT + PLAN:    ICD-10-CM   1. Well woman exam with routine gynecological exam  Z01.419     2. Encounter for gynecological examination with Papanicolaou smear of cervix  Z01.419 Cytology -  PAP( [place])    3. Essential hypertension: begun on losartan  50/HCTZ 12.5 today, No PCP, follow up 6 weeks  I10     4. Menorrhagia with regular cycle: managed effectively with provera  10 mg daily  N92.0     5. Dysmenorrhea  N94.6           Return in about 6 weeks (around 09/17/2023) for BP check.

## 2023-08-09 ENCOUNTER — Encounter: Payer: Self-pay | Admitting: Obstetrics & Gynecology

## 2023-08-09 LAB — CYTOLOGY - PAP
Adequacy: ABSENT
Chlamydia: NEGATIVE
Comment: NEGATIVE
Comment: NEGATIVE
Comment: NEGATIVE
Comment: NORMAL
Diagnosis: NEGATIVE
High risk HPV: NEGATIVE
Neisseria Gonorrhea: NEGATIVE
Trichomonas: POSITIVE — AB

## 2023-08-10 ENCOUNTER — Ambulatory Visit: Payer: Self-pay | Admitting: Obstetrics & Gynecology

## 2023-08-10 MED ORDER — METRONIDAZOLE 500 MG PO TABS: 500.0000 mg | ORAL_TABLET | Freq: Two times a day (BID) | ORAL | 1 refills | Status: AC

## 2023-09-17 ENCOUNTER — Encounter

## 2023-10-08 IMAGING — CT CT HEAD W/O CM
3 series · 15 of 47 positions shown, 18 images · non-contrast
Comparison: None.

CLINICAL DATA: Headache



[Series 2: head w o · axial · 0.44mm/px · z∈[+1117,+1247]mm · 9 of 32 slices shown, 12 images]
[im 3/32  brain]
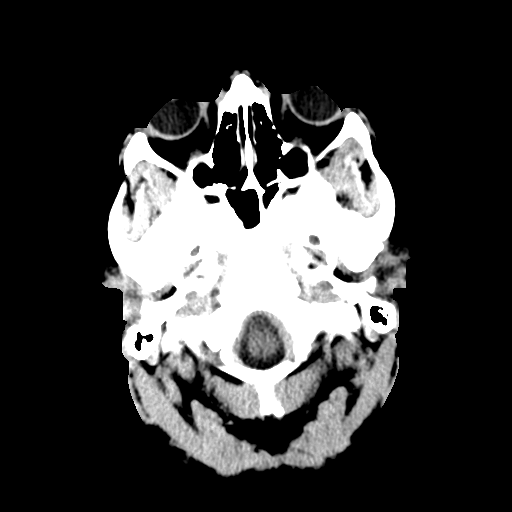
[im 3/32  bone]
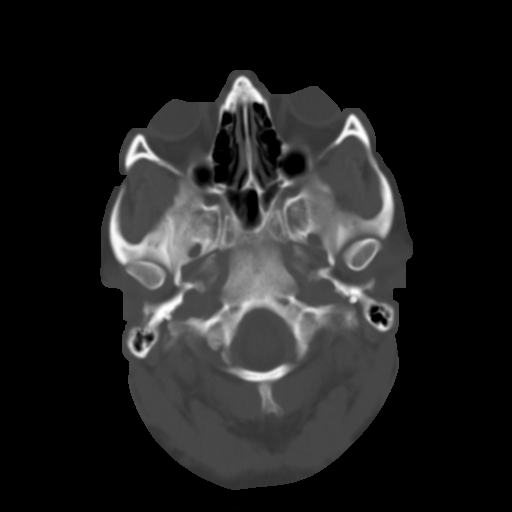
[im 6/32  brain]
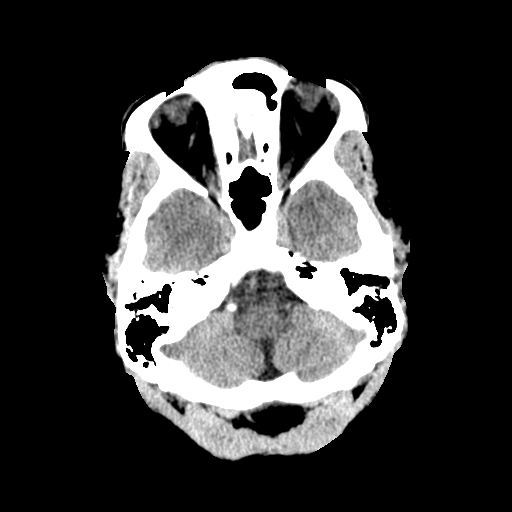
[im 9/32  brain]
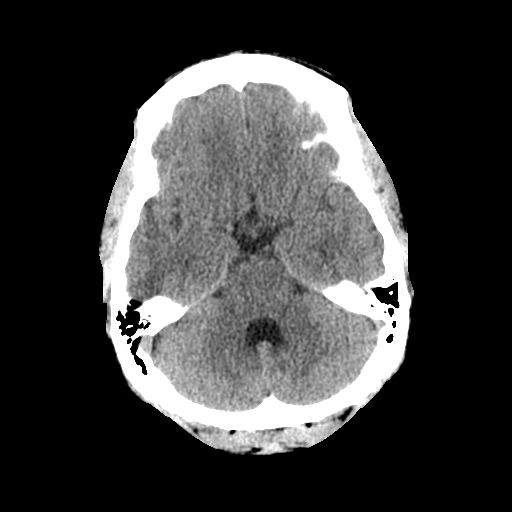
[im 12/32  brain]
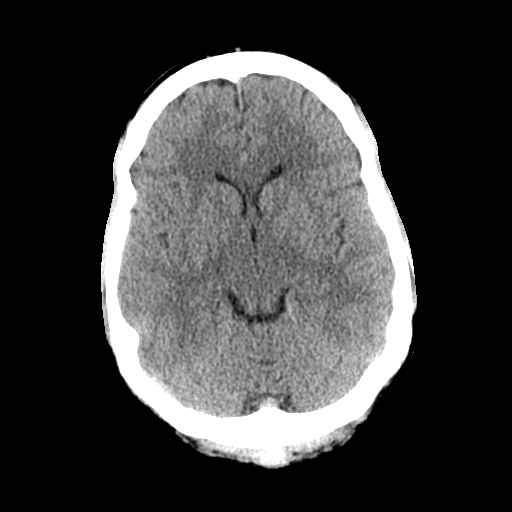
[im 17/32  brain]
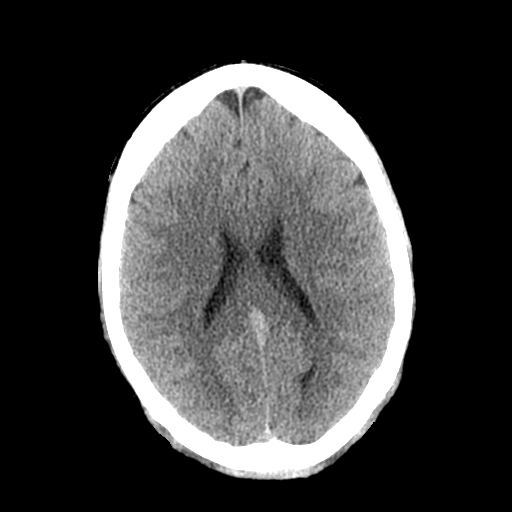
[im 17/32  bone]
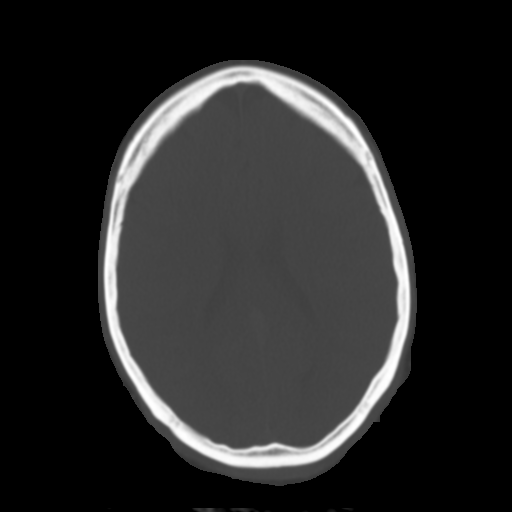
[im 20/32  brain]
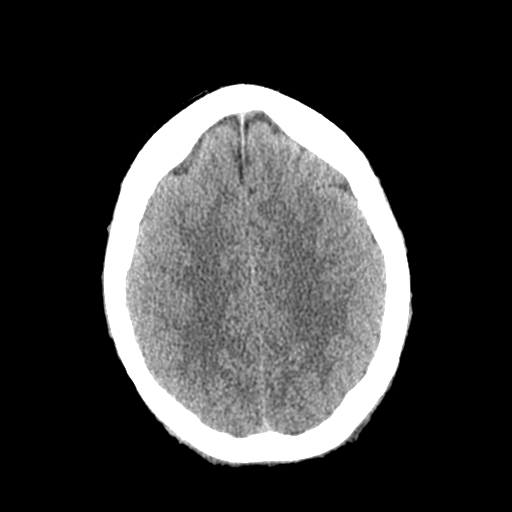
[im 23/32  brain]
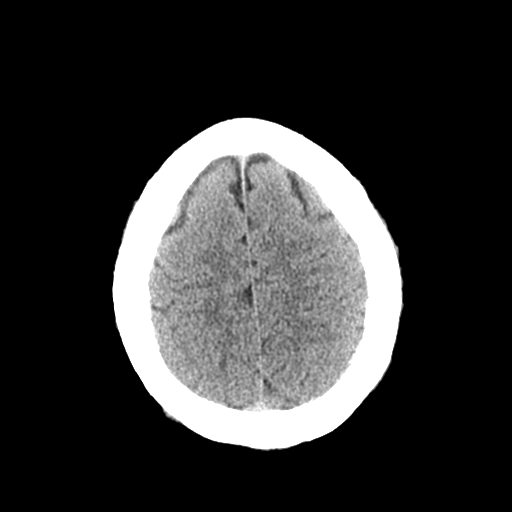
[im 26/32  brain]
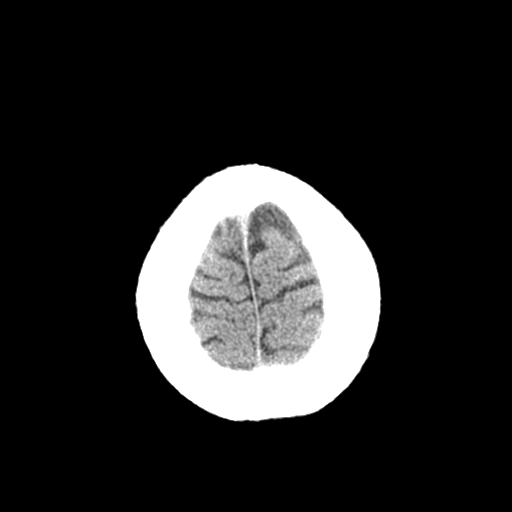
[im 29/32  brain]
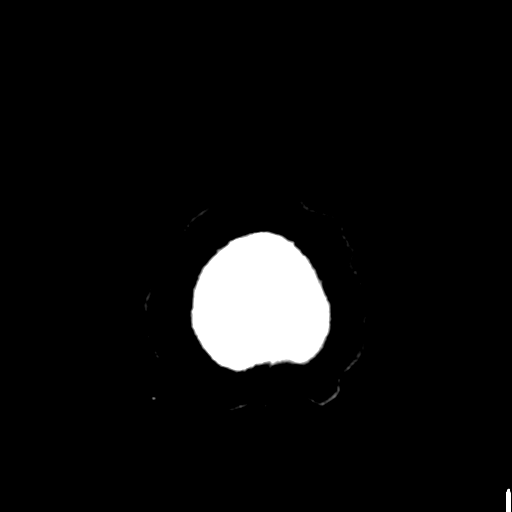
[im 29/32  bone]
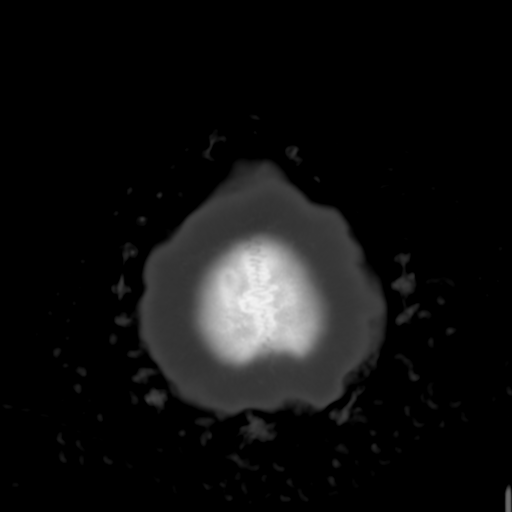

[Series 4: coronal soft · coronal · 0.33mm/px · 3 of 75 slices shown]
[im 25/75  brain]
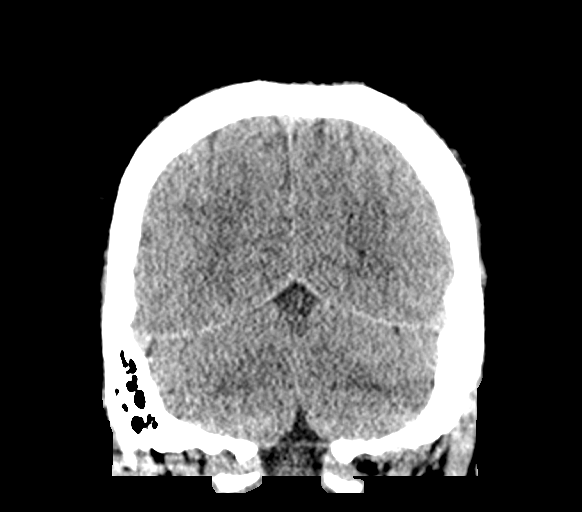
[im 33/75  brain]
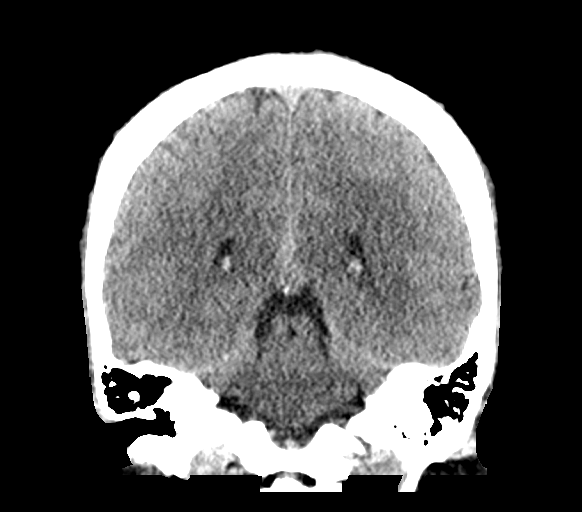
[im 42/75  brain]
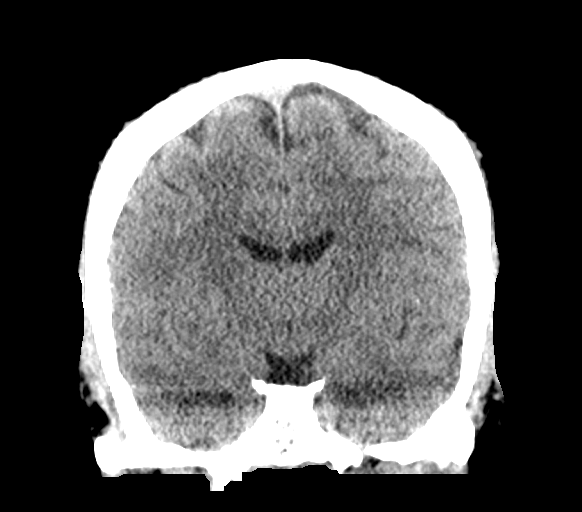

[Series 5: sagittal soft · sagittal · 0.34mm/px · 3 of 58 slices shown]
[im 20/58  brain]
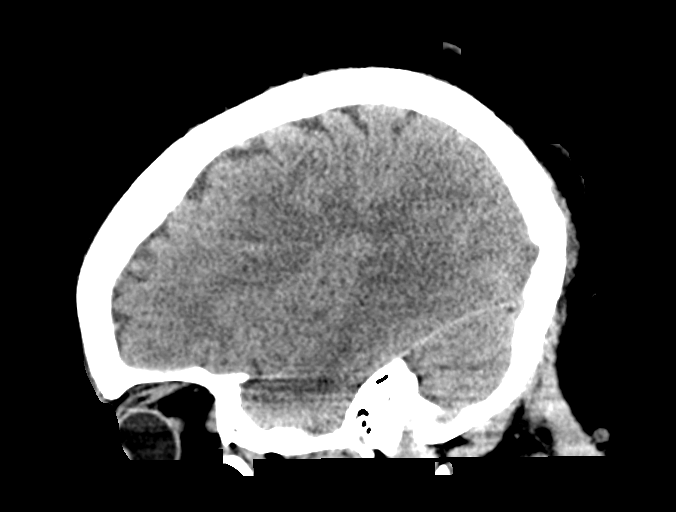
[im 29/58  brain]
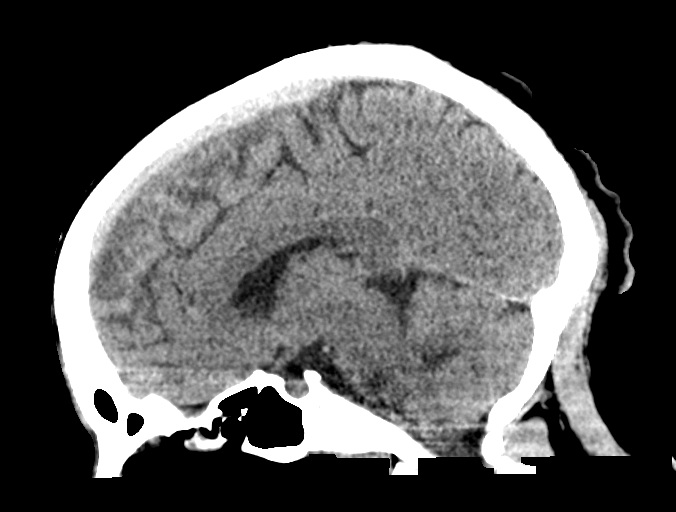
[im 39/58  brain]
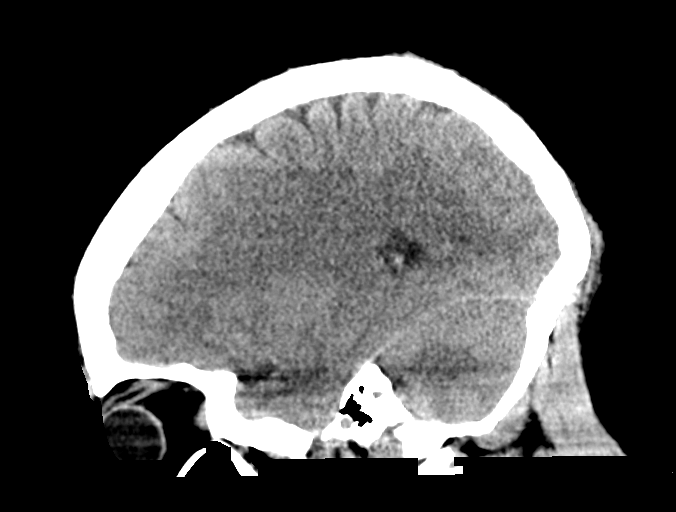

[15 of 47 positions shown; findings below may reference images not displayed]

FINDINGS: Brain: There is no mass, hemorrhage or extra-axial collection. The
size and configuration of the ventricles and extra-axial CSF spaces
are normal. The brain parenchyma is normal, without acute or chronic
infarction.

Vascular: No abnormal hyperdensity of the major intracranial
arteries or dural venous sinuses. No intracranial atherosclerosis.

Skull: The visualized skull base, calvarium and extracranial soft
tissues are normal.

Sinuses/Orbits: No fluid levels or advanced mucosal thickening of
the visualized paranasal sinuses. No mastoid or middle ear effusion.
The orbits are normal.
IMPRESSION: Normal head CT.

## 2023-11-14 ENCOUNTER — Telehealth: Payer: Self-pay | Admitting: Obstetrics & Gynecology

## 2023-11-14 MED ORDER — KETOROLAC TROMETHAMINE 10 MG PO TABS: 10.0000 mg | ORAL_TABLET | Freq: Three times a day (TID) | ORAL | 0 refills | Status: AC | PRN

## 2023-11-14 NOTE — Telephone Encounter (Signed)
 Pt states she her medication this month isn't working and would like a call back from the provider. Please advise.

## 2023-11-14 NOTE — Addendum Note (Signed)
 Addended by: Jancarlo Biermann H on: 11/14/2023 01:34 PM   Modules accepted: Orders

## 2024-01-31 ENCOUNTER — Encounter (HOSPITAL_COMMUNITY): Payer: Self-pay

## 2024-01-31 ENCOUNTER — Other Ambulatory Visit: Payer: Self-pay

## 2024-01-31 ENCOUNTER — Emergency Department (HOSPITAL_COMMUNITY)
Admission: EM | Admit: 2024-01-31 | Discharge: 2024-02-01 | Disposition: A | Discharge status: Home / Self Care | Payer: Self-pay

## 2024-01-31 DIAGNOSIS — M545 Low back pain, unspecified: Secondary | ICD-10-CM | POA: Insufficient documentation

## 2024-01-31 NOTE — ED Triage Notes (Signed)
 Pt reports low back pain x1 day. 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 No injury reported.

## 2024-02-01 MED ORDER — KETOROLAC TROMETHAMINE 15 MG/ML IJ SOLN
15.0000 mg | Freq: Once | INTRAMUSCULAR | Status: AC
  Administered 2024-02-01: 15 mg via INTRAMUSCULAR
  Filled 2024-02-01: qty 1

## 2024-02-01 MED ORDER — NAPROXEN 500 MG PO TABS: 500.0000 mg | ORAL_TABLET | Freq: Two times a day (BID) | ORAL | 0 refills | Status: AC

## 2024-02-01 MED ORDER — METHOCARBAMOL 500 MG PO TABS: 1000.0000 mg | ORAL_TABLET | Freq: Four times a day (QID) | ORAL | 0 refills | Status: AC | PRN

## 2024-02-01 MED ORDER — METHOCARBAMOL 500 MG PO TABS
1000.0000 mg | ORAL_TABLET | Freq: Once | ORAL | Status: AC
  Administered 2024-02-01: 1000 mg via ORAL
  Filled 2024-02-01: qty 2

## 2024-02-01 MED ORDER — METHYLPREDNISOLONE SODIUM SUCC 125 MG IJ SOLR
125.0000 mg | Freq: Once | INTRAMUSCULAR | Status: AC
  Administered 2024-02-01: 125 mg via INTRAMUSCULAR
  Filled 2024-02-01: qty 2

## 2024-02-01 MED ORDER — PREDNISONE 10 MG (21) PO TBPK: ORAL_TABLET | Freq: Every day | ORAL | 0 refills | Status: AC

## 2024-02-01 NOTE — Discharge Instructions (Signed)
 Take your naproxen  and steroids as prescribed.  You can use Robaxin  up to 4 times a day as needed.  You can also take Tylenol  as needed for pain.

## 2024-02-01 NOTE — ED Provider Notes (Signed)
 " [place] EMERGENCY DEPARTMENT AT [facility] Provider Note   CSN: [id number] Arrival date & time: 01/31/24  2027     Patient presents with: Back Pain   Madge Therrien is a 44 y.o. female.   44 year old female presents for evaluation of back pain.  Patient states that started today.  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  She denies any pop, bulging sensation, falls or any injuries.  States is in the middle of her back and nonradiating.  She tried a lidocaine  patch and this did not help much.  Denies any other symptoms or concerns.   Back Pain Associated symptoms: no abdominal pain, no chest pain, no dysuria and no fever        Prior to Admission medications  Medication Sig Start Date End Date Taking? Authorizing Provider  methocarbamol  (ROBAXIN ) 500 MG tablet Take 2 tablets (1,000 mg total) by mouth every 6 (six) hours as needed for up to 7 days for muscle spasms. 02/01/24 02/08/24 Yes Joanathan Affeldt L, DO  naproxen  (NAPROSYN ) 500 MG tablet Take 1 tablet (500 mg total) by mouth 2 (two) times daily for 7 days. 02/01/24 02/08/24 Yes Elda Dunkerson L, DO  predniSONE  (STERAPRED UNI-PAK 21 TAB) 10 MG (21) TBPK tablet Take by mouth daily. Take 6 tabs by mouth daily  for 2 days, then 5 tabs for 2 days, then 4 tabs for 2 days, then 3 tabs for 2 days, 2 tabs for 2 days, then 1 tab by mouth daily for 2 days 02/01/24  Yes Zoraya Fiorenza L, DO  amLODipine  (NORVASC ) 5 MG tablet TAKE 1 TABLET BY MOUTH ONCE DAILY. Patient not taking: Reported on 08/06/2023 06/16/19   Signa Nest A, NP  ferrous sulfate 325 (65 FE) MG tablet Take 325 mg by mouth daily with breakfast. Patient not taking: Reported on 08/06/2023    [provider]  ketorolac  (TORADOL ) 10 MG tablet Take 1 tablet (10 mg total) by mouth every 8 (eight) hours as needed. 11/14/23   Jayne Vonn DEL, MD  losartan -hydrochlorothiazide (HYZAAR) 50-12.5 MG tablet Take 1 tablet by mouth daily. 08/06/23    Jayne Vonn DEL, MD  medroxyPROGESTERone  (PROVERA ) 10 MG tablet Take 1 tablet (10 mg total) by mouth daily. 08/06/23   Jayne Vonn DEL, MD  metroNIDAZOLE  (FLAGYL ) 500 MG tablet Take 1 tablet (500 mg total) by mouth 2 (two) times daily. 08/10/23   Jayne Vonn DEL, MD  traMADol  (ULTRAM ) 50 MG tablet Take 1 tablet (50 mg total) by mouth every 6 (six) hours as needed. 05/31/20   Triplett, Tammy, PA-C    Allergies: Patient has no known allergies.    Review of Systems  Constitutional:  Negative for chills and fever.  HENT:  Negative for ear pain and sore throat.   Eyes:  Negative for pain and visual disturbance.  Respiratory:  Negative for cough and shortness of breath.   Cardiovascular:  Negative for chest pain and palpitations.  Gastrointestinal:  Negative for abdominal pain and vomiting.  Genitourinary:  Negative for dysuria and hematuria.  Musculoskeletal:  Positive for back pain. Negative for arthralgias.  Skin:  Negative for color change and rash.  Neurological:  Negative for seizures and syncope.  All other systems reviewed and are negative.   Updated Vital Signs BP (!) 160/101   Pulse 90   Temp 98.5 F (36.9 C)   Resp 19   Ht 5' 3 (1.6 m)   Wt  95.3 kg   LMP  (LMP Unknown)   SpO2 98%   BMI 37.20 kg/m   Physical Exam Vitals and nursing note reviewed.  Constitutional:      General: She is not in acute distress.    Appearance: Normal appearance. She is well-developed. She is not ill-appearing.  HENT:     Head: Normocephalic and atraumatic.  Eyes:     Conjunctiva/sclera: Conjunctivae normal.  Cardiovascular:     Rate and Rhythm: Normal rate and regular rhythm.     Heart sounds: No murmur heard. Pulmonary:     Effort: Pulmonary effort is normal. No respiratory distress.     Breath sounds: Normal breath sounds.  Abdominal:     Palpations: Abdomen is soft.     Tenderness: There is no abdominal tenderness.  Musculoskeletal:        General: No swelling, tenderness, deformity  or signs of injury.     Cervical back: Neck supple.  Skin:    General: Skin is warm and dry.     Capillary Refill: Capillary refill takes less than 2 seconds.  Neurological:     Mental Status: She is alert.  Psychiatric:        Mood and Affect: Mood normal.     (all labs ordered are listed, but only abnormal results are displayed) Labs Reviewed - No data to display  EKG: None  Radiology: No results found.   Procedures   Medications Ordered in the ED  ketorolac  (TORADOL ) 15 MG/ML injection 15 mg (15 mg Intramuscular Given 02/01/24 0332)  methylPREDNISolone  sodium succinate (SOLU-MEDROL ) 125 mg/2 mL injection 125 mg (125 mg Intramuscular Given 02/01/24 0332)  methocarbamol  (ROBAXIN ) tablet 1,000 mg (1,000 mg Oral Given 02/01/24 0332)                                    Medical Decision Making Patient here for acute nontraumatic midline lower back pain.  Offered x-ray and imaging and she declined.  Will give her pain medication in the ER including Toradol , steroids and a muscle relaxer.  Advised to continue lidocaine  patches as needed I will give her prescriptions for naproxen , Medrol  Dosepak and Robaxin  to use as needed.  Advise close follow-up with primary care and otherwise return to the ER for new or worsening symptoms.  She was comfortable being discharged home.  Problems Addressed: Acute midline low back pain without sciatica: acute illness or injury  Amount and/or Complexity of Data Reviewed External Data Reviewed: notes.    Details: Prior ED records reviewed and patient seen 02-06-2021 for headache  Risk OTC drugs. Prescription drug management.     Final diagnoses:  Acute midline low back pain without sciatica    ED Discharge Orders          Ordered    naproxen  (NAPROSYN ) 500 MG tablet  2 times daily        02/01/24 0308    predniSONE  (STERAPRED UNI-PAK 21 TAB) 10 MG (21) TBPK tablet  Daily        02/01/24 0308    methocarbamol  (ROBAXIN ) 500 MG tablet  Every  6 hours PRN        02/01/24 0308               Brantlee Hinde L, DO 02/01/24 [id number]  "
